# Patient Record
Sex: Female | Born: 1990 | Race: Black or African American | Hispanic: No | Marital: Single | Smoking: Current every day smoker
Health system: Southern US, Community
[De-identification: ages and names within clinical notes are randomized; demographics above are authoritative.]

## PROBLEM LIST (undated history)

## (undated) ENCOUNTER — Inpatient Hospital Stay (HOSPITAL_COMMUNITY): Payer: Self-pay

## (undated) DIAGNOSIS — Z789 Other specified health status: Secondary | ICD-10-CM

## (undated) HISTORY — PX: NO PAST SURGERIES: SHX2092

---

## 2014-05-09 ENCOUNTER — Encounter (HOSPITAL_COMMUNITY): Payer: Self-pay | Admitting: Emergency Medicine

## 2014-05-09 ENCOUNTER — Emergency Department (HOSPITAL_COMMUNITY)
Admission: EM | Admit: 2014-05-09 | Discharge: 2014-05-09 | Disposition: A | Discharge status: Home / Self Care | Payer: BLUE CROSS/BLUE SHIELD | Attending: Emergency Medicine | Admitting: Emergency Medicine

## 2014-05-09 DIAGNOSIS — Z3202 Encounter for pregnancy test, result negative: Secondary | ICD-10-CM | POA: Diagnosis not present

## 2014-05-09 DIAGNOSIS — R55 Syncope and collapse: Secondary | ICD-10-CM

## 2014-05-09 LAB — URINALYSIS, ROUTINE W REFLEX MICROSCOPIC
BILIRUBIN URINE: NEGATIVE
Glucose, UA: NEGATIVE mg/dL
Ketones, ur: NEGATIVE mg/dL
Nitrite: NEGATIVE
Protein, ur: 30 mg/dL — AB
Specific Gravity, Urine: 1.025 (ref 1.005–1.030)
UROBILINOGEN UA: 1 mg/dL (ref 0.0–1.0)
pH: 6 (ref 5.0–8.0)

## 2014-05-09 LAB — URINE MICROSCOPIC-ADD ON

## 2014-05-09 LAB — CBC
HCT: 35.2 % — ABNORMAL LOW (ref 36.0–46.0)
Hemoglobin: 11.6 g/dL — ABNORMAL LOW (ref 12.0–15.0)
MCH: 31.4 pg (ref 26.0–34.0)
MCHC: 33 g/dL (ref 30.0–36.0)
MCV: 95.1 fL (ref 78.0–100.0)
PLATELETS: 193 10*3/uL (ref 150–400)
RBC: 3.7 MIL/uL — AB (ref 3.87–5.11)
RDW: 12.3 % (ref 11.5–15.5)
WBC: 10.4 10*3/uL (ref 4.0–10.5)

## 2014-05-09 LAB — BASIC METABOLIC PANEL
ANION GAP: 9 (ref 5–15)
BUN: 8 mg/dL (ref 6–23)
CHLORIDE: 106 mmol/L (ref 96–112)
CO2: 25 mmol/L (ref 19–32)
CREATININE: 0.66 mg/dL (ref 0.50–1.10)
Calcium: 8.7 mg/dL (ref 8.4–10.5)
GFR calc Af Amer: >90 mL/min (ref >90)
GFR calc non Af Amer: >90 mL/min (ref >90)
GLUCOSE: 99 mg/dL (ref 70–99)
Potassium: 3.2 mmol/L — ABNORMAL LOW (ref 3.5–5.1)
Sodium: 140 mmol/L (ref 135–145)

## 2014-05-09 LAB — CBG MONITORING, ED: Glucose-Capillary: 88 mg/dL (ref 70–99)

## 2014-05-09 LAB — POC URINE PREG, ED: Preg Test, Ur: NEGATIVE

## 2014-05-09 MED ORDER — SODIUM CHLORIDE 0.9 % IV BOLUS (SEPSIS)
1000.0000 mL | Freq: Once | INTRAVENOUS | Status: AC
  Administered 2014-05-09: 1000 mL via INTRAVENOUS

## 2014-05-09 NOTE — Discharge Instructions (Signed)
Syncope °Syncope means a person passes out (faints). The person usually wakes up in less than 5 minutes. It is important to seek medical care for syncope. °HOME CARE °· Have someone stay with you until you feel normal. °· Do not drive, use machines, or play sports until your doctor says it is okay. °· Keep all doctor visits as told. °· Lie down when you feel like you might pass out. Take deep breaths. Wait until you feel normal before standing up. °· Drink enough fluids to keep your pee (urine) clear or pale yellow. °· If you take blood pressure or heart medicine, get up slowly. Take several minutes to sit and then stand. °GET HELP RIGHT AWAY IF:  °· You have a severe headache. °· You have pain in the chest, belly (abdomen), or back. °· You are bleeding from the mouth or butt (rectum). °· You have black or tarry poop (stool). °· You have an irregular or very fast heartbeat. °· You have pain with breathing. °· You keep passing out, or you have shaking (seizures) when you pass out. °· You pass out when sitting or lying down. °· You feel confused. °· You have trouble walking. °· You have severe weakness. °· You have vision problems. °If you fainted, call your local emergency services (911 in [place] Do not drive yourself to the hospital. °MAKE SURE YOU:  °· Understand these instructions. °· Will watch your condition. °· Will get help right away if you are not doing well or get worse. °Document Released: 06/30/2007 Document Revised: 07/13/2011 Document Reviewed: 03/12/2011 °ExitCare® Patient Information ©2015 [employer]. This information is not intended to replace advice given to you by your health care provider. Make sure you discuss any questions you have with your health care provider. ° ° °Emergency Department Resource Guide °1) Find a Doctor and Pay Out of Pocket °Although you won't have to find out who is covered by your insurance plan, it is a good idea to ask around and get recommendations. You will then need  to call the office and see if the doctor you have chosen will accept you as a new patient and what types of options they offer for patients who are self-pay. Some doctors offer discounts or will set up payment plans for their patients who do not have insurance, but you will need to ask so you aren't surprised when you get to your appointment. ° °2) Contact Your Local Health Department °Not all health departments have doctors that can see patients for sick visits, but many do, so it is worth a call to see if yours does. If you don't know where your local health department is, you can check in your phone book. The CDC also has a tool to help you locate your state's health department, and many state websites also have listings of all of their local health departments. ° °3) Find a Walk-in Clinic °If your illness is not likely to be very severe or complicated, you may want to try a walk in clinic. These are popping up all over the country in pharmacies, drugstores, and shopping centers. They're usually staffed by nurse practitioners or physician assistants that have been trained to treat common illnesses and complaints. They're usually fairly quick and inexpensive. However, if you have serious medical issues or chronic medical problems, these are probably not your best option. ° °No Primary Care Doctor: °- Call [facility] at  832-8000 - they can help you locate a primary care doctor   that  accepts your insurance, provides certain services, etc. °- Physician Referral Service- 1-800-533-3463 ° °Chronic Pain Problems: °Organization         Address  Phone   Notes  °[facility]  (336) 297-2271 Patients need to be referred by their primary care doctor.  ° °Medication Assistance: °Organization         Address  Phone   Notes  °[place] [facility] [address] °[place], [place] [address] (336) 641-8030 --Must be a resident of [place] °-- Must have NO insurance  coverage whatsoever (no Medicaid/ Medicare, etc.) °-- The pt. MUST have a primary care doctor that directs their care regularly and follows them in the community °  °[facility]  (866) 331-1348   °[employer]  (888) 892-1162   ° °Agencies that provide inexpensive medical care: °Organization         Address  Phone   Notes  °[facility]  (336) 832-8035   °[facility]    (336) 832-7272   °[facility] [address] °[place], [place] [address] (336) 832-4777   °[facility] [address], °[place] (336) 271-4999   °[facility]    (336) 373-0678   °[facility]    (336) 272-1050   °[facility] ° [address], [place] Phone:  (336) 832-4444, Fax:  (336) 832-4440 Hours of Operation:  9 am - 6 pm, M-F.  Also accepts Medicaid/Medicare and self-pay.  °[facility] ° [address], [place] Phone: (336) 832-3150, Fax: (336) 832-3151. Hours of Operation:  8:30 am - 5:30 pm, M-F.  Also accepts Medicaid and self-pay.  °[facility] [place] [address], [place] Phone: (336) 878-6027   °[facility] [address], [place], [place] (336)723-1848, Ext. 123 Mondays & Thursdays: 7-9 AM.  First 15 patients are seen on a first come, first serve basis. °  ° °Medicaid-accepting [place] Providers: ° °Organization         Address  Phone   Notes  °[facility] [address], [place] (336) 641-2100 Also accepts self-pay patients.  °[facility] [address], [place] ° (336) 856-9996   °[facility] [address], [place] (336) 288-8857   °[facility] [address] [place] [address], [place] (336) 299-7000   °Veita Bland [address], [place]  ° (336) 373-1557 Only accepts [place] Access Medicaid patients after they have their  name applied to their card.  ° °Self-Pay (no insurance) in [place]: ° °Organization         Address  Phone   Notes  °Sickle Cell Patients, [facility] [address], [place] (336) 832-1970   °[facility] [address], [place] (336) 832-4400   °[facility] ° [address], [place] (336) 992-4800   °[facility]/Dr. Osei-Bonsu ° [address] [place] [address], [place] or [address], [place] (336) 841-8500 Phone number for both [place] and [place] locations is the same.  °[facility] [address], [place] (336) 299-0000   °[facility]  [address] [place] [address],  or [address] (336) 852-7530 °(336) 878-2260   °  [facility] [address], [place] (336) 350-1642, phone; (336) 294-5005, fax Sees patients 1st and 3rd Saturday of every month.  Must not qualify for public or private insurance (i.e. Medicaid, Medicare, [place] Health Choice, Veterans' Benefits) • Household income should be no more than 200% of the poverty level •The clinic cannot treat you if you are pregnant or think you are pregnant • Sexually transmitted diseases are not treated at the clinic.  ° ° °Dental Care: °Organization         Address  Phone  Notes  °[facility] [address], [place] (336) 641-6152 Accepts children up to age 21 who are enrolled in Medicaid or [place] Health Choice; pregnant women with a Medicaid card; and children who have applied for Medicaid or [place] Health Choice, but were declined, whose parents can pay a reduced fee at time of service.  °[facility] [place]  [address], [place] (336) 641-7733 Accepts children up to age 21 who are enrolled in Medicaid or [place] Health Choice; pregnant women with a Medicaid card; and children who have applied for  Medicaid or [place] Health Choice, but were declined, whose parents can pay a reduced fee at time of service.  °[facility] ° [address], [place] (336) 641-4533 Patients are seen by appointment only. Walk-ins are not accepted. [facility] will see patients 18 years of age and older. °Monday - Tuesday (8am-5pm) °Most Wednesdays (8:30-5pm) °$30 per visit, cash only  °[facility] ° [address], [place] (336) 641-4533 Patients are seen by appointment only. Walk-ins are not accepted. [facility] will see patients 18 years of age and older. °One Wednesday Evening (Monthly: Volunteer Based).  $30 per visit, cash only  °[facility]  (919) 537-3737 for adults; Children under age 4, call Graduate Pediatric Dentistry at (919) [phone]. Children aged 4-14, please call (919) [phone] to request a pediatric application. ° Dental services are provided in all areas of dental care including fillings, crowns and bridges, complete and partial dentures, implants, gum treatment, root canals, and extractions. Preventive care is also provided. Treatment is provided to both adults and children. °Patients are selected via a lottery and there is often a waiting list. °  °[facility] [address], °[place] ° (336) 763-8833 [email or web address] °  °[facility] [address], [place], [place] (336)723-1848, Ext. 123 Second and Fourth Thursday of each month, opens at 6:30 AM; Clinic ends at 9 AM.  Patients are seen on a first-come first-served basis, and a limited number are seen during each clinic.  ° °[facility] ° [address], [place], [place] (336) 723-7904   Eligibility Requirements °You must have lived in [place], [place], or [place] counties for at least the last three months. °  You cannot be eligible for state or federal sponsored healthcare insurance, including Veterans Administration, Medicaid,  or Medicare. °  You generally cannot be eligible for healthcare insurance through your employer.  °  How to apply: °Eligibility screenings are held every Tuesday and Wednesday afternoon from 1:00 pm until 4:00 pm. You do not need an appointment for the interview!  °[facility] [address], [place], [place] 336-631-2330   °[facility]  336-342-8273   °[facility]  336-703-3100   °[facility]  336-570-6415   ° °Behavioral Health Resources in the Community: °Intensive Outpatient Programs °Organization         Address  Phone  Notes  °[facility] [address], [place], [place] 336-878-6098   °[facility] [address], [place], [place] 336-832-9800   °[facility] [address], [place], [place] ° 336-882-2125   °[facility] [address],  °[place], [place] 1-800-853-5163 or 336-641-4981   °Substance Abuse Resources °Organization         Address  Phone  Notes  °[facility]  336-882-2125   °[facility]  336-784-9470   °[facility]  336-285-9073   °[facility]  336-845-3988   °Residential & Outpatient Substance Abuse Program  1-800-659-3381   °Psychological Services °Organization         Address  Phone  Notes  °[facility]  336- 832-9600   °[facility]  336- 378-7881   °[facility] [address], [place] 1-800-853-5163 or 336-641-4981   ° °Mobile Crisis Teams °Organization         Address  Phone  Notes  °[facility], Mobile Crisis Care Unit  1-877-626-1772   °Assertive °[facility] ° [address] [place], [place] 336-834-9664   °Sharon DeEsch [address] °[place] 336-554-5454   ° °Self-Help/Support Groups °Organization         Address  Phone             Notes  °[facility] [place] [facility] - variety of support groups   336- 373-1402 Call for more information  °Narcotics Anonymous (NA), [facility] [address], °[place]  2 meetings at this location  ° °Residential Treatment Programs °Organization         Address  Phone  Notes  °[facility] [address],    °[place]  1-866-801-8205   °[facility] ° [address], [place], [place] 704-293-8524   °[facility] [address], [place] 336-845-3988 Admissions: 8am-3pm M-F  °[facility] [address]    °[place], [place] 336-841-1104   °[facility] [address], [place], [place] 336-379-7146   °[facility] [address]  °[place], [place] 336-285-9073   °[facility] Intensive Outpatient [address], [place], [place] 336-852-3033   °[facility] [address]  °[place], [place] 1-877-615-2722 or 336-784-9470   °[facility] [address] [place], [place] 336-227-7417 Accepts Medicaid  °[facility] [address]  ° [place] 1-800-659-3381 Substance Abuse/Addiction Treatment  ° °[place] Behavioral Health Resources °Organization         Address  Phone  Notes  °[facility]  (888) 581-9988   °Julie Brannon, PhD [address] [place], [place]   (336) 349-5553 or (336) 951-0000   °[facility]   [address] °[place], [place] (336) 349-4454   °[facility] [address], [place], [place] (336) 342-8316 Insurance/Medicaid/sponsorship through [facility]  °[facility] [address]                                    [place], [place] (336) 342-8316 Therapy/tele-psych/case  °[facility]   [address]  ° [place], [place] (336) 349-2233    °Dr. Arfeen  (336) 349-4544   °[facility] [place]  [employer] [place] [facility] 1) [address], [place] °2) [address], [place] °3)  [address], [place] (336) 349-3220 °(336) 342-7768 ° °(336) 342-8140   °[place] [facility] (336) 342-1394 or (336) 342-3537 (After Hours)    ° ° ° °

## 2014-05-09 NOTE — ED Notes (Signed)
Ambulated patient around unit, no complications. Stated she did not feel dizzy or weak, and that she was feeling better.

## 2014-05-09 NOTE — ED Provider Notes (Signed)
CSN: [id number]     Arrival date & time 05/09/14  [id number] History   First MD Initiated Contact with Patient 05/09/14 1012     Chief Complaint  Patient presents with  . Loss of Consciousness     (Consider location/radiation/quality/duration/timing/severity/associated sxs/prior Treatment) Patient is a 24 y.o. female presenting with syncope.  Loss of Consciousness Episode history:  Single Most recent episode:  Today Duration: thought to be brief. Timing: once. Progression:  Resolved Chronicity:  New Context comment:  Pt has had subjective fevers and chills for past 2-3 days.  felt better this morning.  syncope occured during hot shower Witnessed: no   Relieved by:  Nothing Ineffective treatments:  None tried Associated symptoms: no chest pain, no difficulty breathing, no nausea and no vomiting     History reviewed. No pertinent past medical history. History reviewed. No pertinent past surgical history. History reviewed. No pertinent family history. History  Substance Use Topics  . Smoking status: Not on file  . Smokeless tobacco: Not on file  . Alcohol Use: Not on file   OB History    No data available     Review of Systems  Cardiovascular: Positive for syncope. Negative for chest pain.  Gastrointestinal: Negative for nausea and vomiting.  All other systems reviewed and are negative.     Allergies  Review of patient's allergies indicates no known allergies.  Home Medications   Prior to Admission medications   Medication Sig Start Date End Date Taking? Authorizing Provider  acetaminophen (TYLENOL) 500 MG tablet Take 1,000 mg by mouth every 6 (six) hours as needed for mild pain or moderate pain.   Yes Historical Provider, MD  ibuprofen (ADVIL,MOTRIN) 200 MG tablet Take 600 mg by mouth every 6 (six) hours as needed for headache or moderate pain.   Yes Historical Provider, MD   BP 111/60 mmHg  Pulse 100  Temp(Src) 98.9 F (37.2 C) (Oral)  Resp 18  SpO2 100%  LMP  04/16/2014 Physical Exam  Constitutional: She is oriented to person, place, and time. She appears well-developed and well-nourished. No distress.  HENT:  Head: Normocephalic and atraumatic.  Mouth/Throat: Oropharynx is clear and moist.  Eyes: Conjunctivae are normal. Pupils are equal, round, and reactive to light. No scleral icterus.  Neck: Neck supple.  Cardiovascular: Normal rate, regular rhythm, normal heart sounds and intact distal pulses.   No murmur heard. Pulmonary/Chest: Effort normal and breath sounds normal. No stridor. No respiratory distress. She has no rales.  Abdominal: Soft. Bowel sounds are normal. She exhibits no distension. There is no tenderness.  Musculoskeletal: Normal range of motion.  Neurological: She is alert and oriented to person, place, and time.  Skin: Skin is warm and dry. No rash noted.  Psychiatric: She has a normal mood and affect. Her behavior is normal.  Nursing note and vitals reviewed.   ED Course  Procedures (including critical care time) Labs Review Labs Reviewed  CBC - Abnormal; Notable for the following:    RBC 3.70 (*)    Hemoglobin 11.6 (*)    HCT 35.2 (*)    All other components within normal limits  BASIC METABOLIC PANEL - Abnormal; Notable for the following:    Potassium 3.2 (*)    All other components within normal limits  URINALYSIS, ROUTINE W REFLEX MICROSCOPIC - Abnormal; Notable for the following:    Color, Urine AMBER (*)    APPearance CLOUDY (*)    Hgb urine dipstick LARGE (*)    Protein,  ur 30 (*)    Leukocytes, UA MODERATE (*)    All other components within normal limits  URINE MICROSCOPIC-ADD ON - Abnormal; Notable for the following:    Bacteria, UA FEW (*)    Casts HYALINE CASTS (*)    All other components within normal limits  CBG MONITORING, ED  POC URINE PREG, ED    Imaging Review No results found.   EKG Interpretation   Date/Time:  Thursday May 09 2014 10:13:06 EDT Ventricular Rate:  88 PR Interval:   147 QRS Duration: 91 QT Interval:  349 QTC Calculation: 422 R Axis:   58 Text Interpretation:  Sinus rhythm Borderline T abnormalities, anterior  leads No old tracing to compare Confirmed by [facility]  MD, TREY ([id number]) on  05/09/2014 11:16:24 AM      MDM   Final diagnoses:  Syncope, unspecified syncope type    Well appearing, nontoxic 24 yo female presenting after syncope.  She had a febrile illness for past several days.  She was feeling better, but still feeling rundown and fatigued.  She took a hot shower, during which she passed out.  Now, she feels better than she did when she woke up this morning.    Labs unremarkable.  EKG without delta wave, brugada, or prolonged QT.  She ambulated without difficulty or symptoms . Suspect she probably had syncope due to recent illness and hot shower.  She appears stable for dc.  Given return precautions.     Blake Divine, MD 05/09/14 1535

## 2014-05-09 NOTE — ED Notes (Signed)
Per EMS-states she was taking a shower got dizzy and passed out-doesn't know if she hit head-headache for 3 days-12 lead normal-BP 107/70, HR 95

## 2014-05-09 NOTE — ED Notes (Signed)
Bed: WA21 Expected date:  Expected time:  Means of arrival:  Comments: EMS-migraine

## 2016-03-29 ENCOUNTER — Inpatient Hospital Stay (HOSPITAL_COMMUNITY)
Admission: AD | Admit: 2016-03-29 | Discharge: 2016-03-29 | Disposition: A | Discharge status: Home / Self Care | Payer: BLUE CROSS/BLUE SHIELD | Source: Ambulatory Visit | Attending: Family Medicine | Admitting: Family Medicine

## 2016-03-29 ENCOUNTER — Encounter (HOSPITAL_COMMUNITY): Payer: Self-pay

## 2016-03-29 ENCOUNTER — Inpatient Hospital Stay (HOSPITAL_COMMUNITY): Payer: BLUE CROSS/BLUE SHIELD

## 2016-03-29 DIAGNOSIS — Z3A08 8 weeks gestation of pregnancy: Secondary | ICD-10-CM | POA: Diagnosis not present

## 2016-03-29 DIAGNOSIS — R109 Unspecified abdominal pain: Secondary | ICD-10-CM | POA: Insufficient documentation

## 2016-03-29 DIAGNOSIS — O4691 Antepartum hemorrhage, unspecified, first trimester: Secondary | ICD-10-CM | POA: Diagnosis not present

## 2016-03-29 DIAGNOSIS — O26899 Other specified pregnancy related conditions, unspecified trimester: Secondary | ICD-10-CM

## 2016-03-29 DIAGNOSIS — O209 Hemorrhage in early pregnancy, unspecified: Secondary | ICD-10-CM

## 2016-03-29 DIAGNOSIS — B9689 Other specified bacterial agents as the cause of diseases classified elsewhere: Secondary | ICD-10-CM

## 2016-03-29 DIAGNOSIS — O23591 Infection of other part of genital tract in pregnancy, first trimester: Secondary | ICD-10-CM | POA: Diagnosis not present

## 2016-03-29 DIAGNOSIS — N760 Acute vaginitis: Secondary | ICD-10-CM

## 2016-03-29 DIAGNOSIS — O3680X0 Pregnancy with inconclusive fetal viability, not applicable or unspecified: Secondary | ICD-10-CM

## 2016-03-29 HISTORY — DX: Other specified health status: Z78.9

## 2016-03-29 LAB — URINALYSIS, ROUTINE W REFLEX MICROSCOPIC
BILIRUBIN URINE: NEGATIVE
Glucose, UA: NEGATIVE mg/dL
Ketones, ur: NEGATIVE mg/dL
LEUKOCYTES UA: NEGATIVE
NITRITE: NEGATIVE
PROTEIN: 100 mg/dL — AB
Specific Gravity, Urine: 1.03 (ref 1.005–1.030)
pH: 6 (ref 5.0–8.0)

## 2016-03-29 LAB — CBC
HCT: 35.2 % — ABNORMAL LOW (ref 36.0–46.0)
HEMOGLOBIN: 11.8 g/dL — AB (ref 12.0–15.0)
MCH: 32.3 pg (ref 26.0–34.0)
MCHC: 33.5 g/dL (ref 30.0–36.0)
MCV: 96.4 fL (ref 78.0–100.0)
Platelets: 270 10*3/uL (ref 150–400)
RBC: 3.65 MIL/uL — ABNORMAL LOW (ref 3.87–5.11)
RDW: 12.6 % (ref 11.5–15.5)
WBC: 6.4 10*3/uL (ref 4.0–10.5)

## 2016-03-29 LAB — POCT PREGNANCY, URINE: PREG TEST UR: POSITIVE — AB

## 2016-03-29 LAB — WET PREP, GENITAL
Sperm: NONE SEEN
Trich, Wet Prep: NONE SEEN
WBC WET PREP: NONE SEEN
Yeast Wet Prep HPF POC: NONE SEEN

## 2016-03-29 LAB — HCG, QUANTITATIVE, PREGNANCY: hCG, Beta Chain, Quant, S: 122 m[IU]/mL — ABNORMAL HIGH (ref <5)

## 2016-03-29 MED ORDER — METRONIDAZOLE 500 MG PO TABS: 500.0000 mg | ORAL_TABLET | Freq: Two times a day (BID) | ORAL | 0 refills | Status: DC

## 2016-03-29 NOTE — MAU Provider Note (Signed)
History     CSN: [id number]  Arrival date and time: 03/29/16 9562   First Provider Initiated Contact with Patient 03/29/16 2108       Chief Complaint  Patient presents with  . Possible Pregnancy  . Abdominal Pain  . Vaginal Bleeding   HPI Jenny Gould is a 26 y.o. G1P0 at [redacted]w[redacted]d by unsure LMP who presents with abdominal pain & vaginal bleeding. Has positive HPT yesterday. Symptoms began over the weekend. Patient thought she was starting her period but "felt weird". Reports lower abdominal cramping that comes & goes. Rates pain 7/10. Has been taking ES tylenol with moderate relief. Vaginal bleeding that she has to wear a pad for. Is not saturating pads or passing clots. Describes light red blood & states the amount is comparable to the end of her period. Denies n/v/d, constipation, dysuria, vaginal discharge, or fever.   OB History    Gravida Para Term Preterm AB Living   1             SAB TAB Ectopic Multiple Live Births                  Past Medical History:  Diagnosis Date  . Medical history non-contributory     Past Surgical History:  Procedure Laterality Date  . NO PAST SURGERIES      No family history on file.  Social History  Substance Use Topics  . Smoking status: Not on file  . Smokeless tobacco: Not on file  . Alcohol use Not on file    Allergies: No Known Allergies  Prescriptions Prior to Admission  Medication Sig Dispense Refill Last Dose  . acetaminophen (TYLENOL) 500 MG tablet Take 1,000 mg by mouth every 6 (six) hours as needed for mild pain or moderate pain.   Past Week at Unknown time  . ibuprofen (ADVIL,MOTRIN) 200 MG tablet Take 600 mg by mouth every 6 (six) hours as needed for headache or moderate pain.   05/07/2014    Review of Systems  Constitutional: Negative.   Gastrointestinal: Positive for abdominal pain. Negative for constipation, diarrhea, nausea and vomiting.  Genitourinary: Positive for vaginal bleeding. Negative for dysuria and  vaginal discharge.  Musculoskeletal: Negative for back pain.   Physical Exam   Blood pressure 123/79, pulse 79, temperature 98.6 F (37 C), temperature source Oral, resp. rate 16, height 5\' 9"  (1.753 m), weight 186 lb (84.4 kg), last menstrual period 01/30/2016.  Physical Exam  Nursing note and vitals reviewed. Constitutional: She is oriented to person, place, and time. She appears well-developed and well-nourished. No distress.  HENT:  Head: Normocephalic and atraumatic.  Eyes: Conjunctivae are normal. Right eye exhibits no discharge. Left eye exhibits no discharge. No scleral icterus.  Neck: Normal range of motion.  Respiratory: Effort normal. No respiratory distress.  GI: Soft. She exhibits no distension. There is no tenderness.  Genitourinary: Uterus normal. Cervix exhibits no motion tenderness and no friability. Right adnexum displays no mass and no tenderness. Left adnexum displays no mass and no tenderness. There is bleeding (Kichline amount of dark red blood; no active bleeding) in the vagina.  Genitourinary Comments: Cervix closed  Neurological: She is alert and oriented to person, place, and time.  Skin: Skin is warm and dry. She is not diaphoretic.  Psychiatric: She has a normal mood and affect. Her behavior is normal. Judgment and thought content normal.    MAU Course  Procedures Results for orders placed or performed during the hospital encounter  of 03/29/16 (from the past 24 hour(s))  Urinalysis, Routine w reflex microscopic     Status: Abnormal   Collection Time: 03/29/16  7:27 PM  Result Value Ref Range   Color, Urine YELLOW YELLOW   APPearance HAZY (A) CLEAR   Specific Gravity, Urine 1.030 1.005 - 1.030   pH 6.0 5.0 - 8.0   Glucose, UA NEGATIVE NEGATIVE mg/dL   Hgb urine dipstick LARGE (A) NEGATIVE   Bilirubin Urine NEGATIVE NEGATIVE   Ketones, ur NEGATIVE NEGATIVE mg/dL   Protein, ur 409100 (A) NEGATIVE mg/dL   Nitrite NEGATIVE NEGATIVE   Leukocytes, UA NEGATIVE  NEGATIVE   RBC / HPF TOO NUMEROUS TO COUNT 0 - 5 RBC/hpf   WBC, UA 6-30 0 - 5 WBC/hpf   Bacteria, UA RARE (A) NONE SEEN   Squamous Epithelial / LPF 0-5 (A) NONE SEEN   Mucous PRESENT   Pregnancy, urine POC     Status: Abnormal   Collection Time: 03/29/16  7:44 PM  Result Value Ref Range   Preg Test, Ur POSITIVE (A) NEGATIVE  CBC     Status: Abnormal   Collection Time: 03/29/16  7:52 PM  Result Value Ref Range   WBC 6.4 4.0 - 10.5 K/uL   RBC 3.65 (L) 3.87 - 5.11 MIL/uL   Hemoglobin 11.8 (L) 12.0 - 15.0 g/dL   HCT 81.135.2 (L) 91.436.0 - 78.246.0 %   MCV 96.4 78.0 - 100.0 fL   MCH 32.3 26.0 - 34.0 pg   MCHC 33.5 30.0 - 36.0 g/dL   RDW 95.612.6 21.311.5 - 08.615.5 %   Platelets 270 150 - 400 K/uL  ABO/Rh     Status: None (Preliminary result)   Collection Time: 03/29/16  7:52 PM  Result Value Ref Range   ABO/RH(D) O POS   hCG, quantitative, pregnancy     Status: Abnormal   Collection Time: 03/29/16  7:52 PM  Result Value Ref Range   hCG, Beta Chain, Quant, S 122 (H) <5 mIU/mL   [place] Ob Comp Less 14 Wks  Result Date: 03/29/2016 CLINICAL DATA:  Vaginal bleeding and cramping EXAM: OBSTETRIC <14 WK US AND TRANSVAGINAL OB US TECHNIQUE: Both transabdominal and transvaginal ultrasound examinations were performed for complete evaluation of the gestation as well as the maternal uterus, adnexal regions, and pelvic cul-de-sac. Transvaginal technique was performed to assess early pregnancy. COMPARISON:  None. FINDINGS: Intrauterine gestational sac: Not visualized Yolk sac:  Not visualized Embryo:  Not visualized Maternal uterus/adnexae: Right ovary is within normal limits and measures 2.1 by 3.5 x 2.2 cm. The left ovary measures 1.9 by 3 x 1.7 cm. Adjacent to or exophytic from the left ovary is a simple cysts measuring 3.2 x 2.4 by 3 cm. No free fluid IMPRESSION: 1. No intrauterine pregnancy is visualized. Correlation with serial HCG and follow-up pelvic ultrasound as clinically indicated 2. 3.2 cm simple appearing left  adnexal cyst Electronically Signed   By: Jasmine PangKim  Fujinaga M.D.   On: 03/29/2016 20:45   [place] Ob Transvaginal  Result Date: 03/29/2016 CLINICAL DATA:  Vaginal bleeding and cramping EXAM: OBSTETRIC <14 WK US AND TRANSVAGINAL OB US TECHNIQUE: Both transabdominal and transvaginal ultrasound examinations were performed for complete evaluation of the gestation as well as the maternal uterus, adnexal regions, and pelvic cul-de-sac. Transvaginal technique was performed to assess early pregnancy. COMPARISON:  None. FINDINGS: Intrauterine gestational sac: Not visualized Yolk sac:  Not visualized Embryo:  Not visualized Maternal uterus/adnexae: Right ovary is within normal limits and measures 2.1  by 3.5 x 2.2 cm. The left ovary measures 1.9 by 3 x 1.7 cm. Adjacent to or exophytic from the left ovary is a simple cysts measuring 3.2 x 2.4 by 3 cm. No free fluid IMPRESSION: 1. No intrauterine pregnancy is visualized. Correlation with serial HCG and follow-up pelvic ultrasound as clinically indicated 2. 3.2 cm simple appearing left adnexal cyst Electronically Signed   By: Jasmine Pang M.D.   On: 03/29/2016 20:45    MDM +UPT UA, wet prep, GC/chlamydia, CBC, ABO/Rh, quant hCG, HIV, and [place] today to rule out ectopic pregnancy O positive BHCG 122 Ultrasound shows no IUP. "Simply left adnexal cyst".  This vaginal bleeding & abdominal pain could represent a normal pregnancy, spontaneous abortion, or even an ectopic pregnancy which can be life-threatening. Cultures were obtained to rule out pelvic infection.   Assessment and Plan  A: 1. Pregnancy of unknown anatomic location   2. Vaginal bleeding in pregnancy, first trimester   3. Abdominal pain affecting pregnancy   4. BV (bacterial vaginosis)    P: Discharge home Rx flagyl GC/CT pending Go to [facility] Thursday morning for repeat BHCG Pelvic rest Discussed reasons to return to MAU including s/s ectopic  Judeth Horn 03/29/2016, 9:08 PM

## 2016-03-29 NOTE — Discharge Instructions (Signed)
Return to care   If you have heavier bleeding that soaks through more that 2 pads per hour for an hour or more  If you bleed so much that you feel like you might pass out or you do pass out  If you have significant abdominal pain that is not improved with Tylenol   If you develop a fever > 100.5     Bacterial Vaginosis Bacterial vaginosis is a vaginal infection that occurs when the normal balance of bacteria in the vagina is disrupted. It results from an overgrowth of certain bacteria. This is the most common vaginal infection among women ages 29-44. Because bacterial vaginosis increases your risk for STIs (sexually transmitted infections), getting treated can help reduce your risk for chlamydia, gonorrhea, herpes, and HIV (human immunodeficiency virus). Treatment is also important for preventing complications in pregnant women, because this condition can cause an early (premature) delivery. What are the causes? This condition is caused by an increase in harmful bacteria that are normally present in Deetz amounts in the vagina. However, the reason that the condition develops is not fully understood. What increases the risk? The following factors may make you more likely to develop this condition:  Having a new sexual partner or multiple sexual partners.  Having unprotected sex.  Douching.  Having an intrauterine device (IUD).  Smoking.  Drug and alcohol abuse.  Taking certain antibiotic medicines.  Being pregnant. You cannot get bacterial vaginosis from toilet seats, bedding, swimming pools, or contact with objects around you. What are the signs or symptoms? Symptoms of this condition include:  Grey or white vaginal discharge. The discharge can also be watery or foamy.  A fish-like odor with discharge, especially after sexual intercourse or during menstruation.  Itching in and around the vagina.  Burning or pain with urination. Some women with bacterial vaginosis have  no signs or symptoms. How is this diagnosed? This condition is diagnosed based on:  Your medical history.  A physical exam of the vagina.  Testing a sample of vaginal fluid under a microscope to look for a large amount of bad bacteria or abnormal cells. Your health care provider may use a cotton swab or a Spinnato wooden spatula to collect the sample. How is this treated? This condition is treated with antibiotics. These may be given as a pill, a vaginal cream, or a medicine that is put into the vagina (suppository). If the condition comes back after treatment, a second round of antibiotics may be needed. Follow these instructions at home: Medicines   Take over-the-counter and prescription medicines only as told by your health care provider.  Take or use your antibiotic as told by your health care provider. Do not stop taking or using the antibiotic even if you start to feel better. General instructions   If you have a female sexual partner, tell her that you have a vaginal infection. She should see her health care provider and be treated if she has symptoms. If you have a female sexual partner, he does not need treatment.  During treatment:  Avoid sexual activity until you finish treatment.  Do not douche.  Avoid alcohol as directed by your health care provider.  Avoid breastfeeding as directed by your health care provider.  Drink enough water and fluids to keep your urine clear or pale yellow.  Keep the area around your vagina and rectum clean.  Wash the area daily with warm water.  Wipe yourself from front to back after using the toilet.  Keep all follow-up visits as told by your health care provider. This is important. How is this prevented?  Do not douche.  Wash the outside of your vagina with warm water only.  Use protection when having sex. This includes latex condoms and dental dams.  Limit how many sexual partners you have. To help prevent bacterial vaginosis, it  is best to have sex with just one partner (monogamous).  Make sure you and your sexual partner are tested for STIs.  Wear cotton or cotton-lined underwear.  Avoid wearing tight pants and pantyhose, especially during summer.  Limit the amount of alcohol that you drink.  Do not use any products that contain nicotine or tobacco, such as cigarettes and e-cigarettes. If you need help quitting, ask your health care provider.  Do not use illegal drugs. Where to find more information:  Centers for Disease Control and Prevention: SolutionApps.co.zawww.cdc.gov/std  American Sexual Health Association (ASHA): www.ashastd.org  U.S. Department of Health and Health and safety inspectorHuman Services, Office on Women's Health: ConventionalMedicines.siwww.womenshealth.gov/ or http://www.anderson-williamson.info/https://www.womenshealth.gov/a-z-topics/bacterial-vaginosis Contact a health care provider if:  Your symptoms do not improve, even after treatment.  You have more discharge or pain when urinating.  You have a fever.  You have pain in your abdomen.  You have pain during sex.  You have vaginal bleeding between periods. Summary  Bacterial vaginosis is a vaginal infection that occurs when the normal balance of bacteria in the vagina is disrupted.  Because bacterial vaginosis increases your risk for STIs (sexually transmitted infections), getting treated can help reduce your risk for chlamydia, gonorrhea, herpes, and HIV (human immunodeficiency virus). Treatment is also important for preventing complications in pregnant women, because the condition can cause an early (premature) delivery.  This condition is treated with antibiotic medicines. These may be given as a pill, a vaginal cream, or a medicine that is put into the vagina (suppository). This information is not intended to replace advice given to you by your health care provider. Make sure you discuss any questions you have with your health care provider. Document Released: 01/11/2005 Document Revised: 09/27/2015 Document Reviewed:  09/27/2015 Elsevier Interactive Patient Education  2017 Elsevier [employer]. Vaginal Bleeding During Pregnancy, First Trimester A Cleek amount of bleeding (spotting) from the vagina is relatively common in early pregnancy. It usually stops on its own. Various things may cause bleeding or spotting in early pregnancy. Some bleeding may be related to the pregnancy, and some may not. In most cases, the bleeding is normal and is not a problem. However, bleeding can also be a sign of something serious. Be sure to tell your health care provider about any vaginal bleeding right away. Some possible causes of vaginal bleeding during the first trimester include:  Infection or inflammation of the cervix.  Growths (polyps) on the cervix.  Miscarriage or threatened miscarriage.  Pregnancy tissue has developed outside of the uterus and in a fallopian tube (tubal pregnancy).  Tiny cysts have developed in the uterus instead of pregnancy tissue (molar pregnancy). Follow these instructions at home: Watch your condition for any changes. The following actions may help to lessen any discomfort you are feeling:  Follow your health care provider's instructions for limiting your activity. If your health care provider orders bed rest, you may need to stay in bed and only get up to use the bathroom. However, your health care provider may allow you to continue light activity.  If needed, make plans for someone to help with your regular activities and responsibilities while you are on bed  rest.  Keep track of the number of pads you use each day, how often you change pads, and how soaked (saturated) they are. Write this down.  Do not use tampons. Do not douche.  Do not have sexual intercourse or orgasms until approved by your health care provider.  If you pass any tissue from your vagina, save the tissue so you can show it to your health care provider.  Only take over-the-counter or prescription medicines as directed by  your health care provider.  Do not take aspirin because it can make you bleed.  Keep all follow-up appointments as directed by your health care provider. Contact a health care provider if:  You have any vaginal bleeding during any part of your pregnancy.  You have cramps or labor pains.  You have a fever, not controlled by medicine. Get help right away if:  You have severe cramps in your back or belly (abdomen).  You pass large clots or tissue from your vagina.  Your bleeding increases.  You feel light-headed or weak, or you have fainting episodes.  You have chills.  You are leaking fluid or have a gush of fluid from your vagina.  You pass out while having a bowel movement. This information is not intended to replace advice given to you by your health care provider. Make sure you discuss any questions you have with your health care provider. Document Released: 10/21/2004 Document Revised: 06/19/2015 Document Reviewed: 09/18/2012 Elsevier Interactive Patient Education  2017 [employer].

## 2016-03-29 NOTE — MAU Note (Signed)
Pt states that she missed her period in February. Had a +upt at home. States today she started having some light spotting and vaginal bleeding. Has some lower abdominal cramping like a period. Rates 7/10. Has not taken anything today. LMP: around 01/30/2016.

## 2016-03-30 LAB — GC/CHLAMYDIA PROBE AMP (~~LOC~~) NOT AT ARMC
Chlamydia: NEGATIVE
Neisseria Gonorrhea: NEGATIVE

## 2016-03-30 LAB — HIV ANTIBODY (ROUTINE TESTING W REFLEX): HIV Screen 4th Generation wRfx: NONREACTIVE

## 2016-03-30 LAB — ABO/RH: ABO/RH(D): O POS

## 2016-04-01 ENCOUNTER — Ambulatory Visit: Payer: BLUE CROSS/BLUE SHIELD | Admitting: *Deleted

## 2016-04-01 DIAGNOSIS — O3680X0 Pregnancy with inconclusive fetal viability, not applicable or unspecified: Secondary | ICD-10-CM

## 2016-04-01 LAB — HCG, QUANTITATIVE, PREGNANCY: hCG, Beta Chain, Quant, S: 36 m[IU]/mL — ABNORMAL HIGH (ref <5)

## 2016-04-01 NOTE — Progress Notes (Signed)
Pt in for stat hcg due to pregnancy of unknown location. She has no pain and the bleeding is going away. Pt stated that she can be reached at [phone]. Advised patient that I will call her after lunch with results. She had no further questions or concerns. Per Dr. Alysia PennaErvin patient should repeat hcg level in two weeks. Called patient and informed her. She will return on 3/22.

## 2016-04-02 ENCOUNTER — Inpatient Hospital Stay (HOSPITAL_COMMUNITY)
Admission: AD | Admit: 2016-04-02 | Discharge: 2016-04-02 | Disposition: A | Discharge status: Home / Self Care | Payer: BLUE CROSS/BLUE SHIELD | Source: Ambulatory Visit | Attending: Obstetrics & Gynecology | Admitting: Obstetrics & Gynecology

## 2016-04-02 DIAGNOSIS — Z3A09 9 weeks gestation of pregnancy: Secondary | ICD-10-CM | POA: Insufficient documentation

## 2016-04-02 DIAGNOSIS — O039 Complete or unspecified spontaneous abortion without complication: Secondary | ICD-10-CM | POA: Insufficient documentation

## 2016-04-02 NOTE — MAU Note (Signed)
Patient presents with thinking having a miscarriage, bleeding has stopped and having no pain.

## 2016-04-02 NOTE — Discharge Instructions (Signed)

## 2016-04-02 NOTE — MAU Provider Note (Signed)
  History     CSN: [id number]  Arrival date and time: 04/02/16 0910   None     No chief complaint on file.  Patient is a 26 year old G1 P0 at 9 weeks and 0 days who presents with concern for miscarriage. She was seen in the MA U about a week ago and had a Quant of 122. At that time she is having some cramping and mild bleeding. Repeat Quant 48 hours later showed 32. She has had no bleeding since then and reports no ongoing abdominal pain. She wants to know for sure today if she is miscarrying or not. She reports no fevers chills. No abnormal vaginal discharge.    OB History    Gravida Para Term Preterm AB Living   1             SAB TAB Ectopic Multiple Live Births                  Past Medical History:  Diagnosis Date  . Medical history non-contributory     Past Surgical History:  Procedure Laterality Date  . NO PAST SURGERIES      No family history on file.  Social History  Substance Use Topics  . Smoking status: Not on file  . Smokeless tobacco: Not on file  . Alcohol use Not on file    Allergies: No Known Allergies  Prescriptions Prior to Admission  Medication Sig Dispense Refill Last Dose  . acetaminophen (TYLENOL) 500 MG tablet Take 1,000 mg by mouth every 6 (six) hours as needed for mild pain or moderate pain.   Past Week at Unknown time  . metroNIDAZOLE (FLAGYL) 500 MG tablet Take 1 tablet (500 mg total) by mouth 2 (two) times daily. 14 tablet 0     Review of Systems  Constitutional: Negative for chills and fever.  HENT: Negative for congestion and rhinorrhea.   Respiratory: Negative for cough and shortness of breath.   Gastrointestinal: Negative for abdominal pain, constipation, diarrhea, nausea and vomiting.  Genitourinary: Negative for difficulty urinating, dysuria and flank pain.  Neurological: Negative for dizziness and weakness.   Physical Exam   Blood pressure 115/68, pulse 79, temperature 98.5 F (36.9 C), temperature source Oral, resp. rate  18, weight 184 lb 1.9 oz (83.5 kg), last menstrual period 01/30/2016.  Physical Exam  Constitutional: She is oriented to person, place, and time. She appears well-developed and well-nourished.  HENT:  Head: Normocephalic and atraumatic.  Eyes: Conjunctivae are normal. Pupils are equal, round, and reactive to light.  Cardiovascular: Normal rate.   Respiratory: Effort normal. No respiratory distress.  GI: Soft. She exhibits no distension. There is no tenderness.  Neurological: She is alert and oriented to person, place, and time.  Skin: Skin is warm and dry.  Psychiatric: She has a normal mood and affect. Her behavior is normal.    MAU Course  Procedures  MDM In MA U discussed with the patient that she most likely had miscarried given the drop Quant from 122-32. Advised that this time being 12 hours from the last Quant and too soon for a repeat ultrasound I could not tell her anything for certain although most likely a miscarriage.  Assessment and Plan  Miscarriage: Patient to follow-up in the office in 1-2 weeks for possible confirmatory negative UPT versus repeat ultrasound or beta hCG depending on patient's symptoms at that time.  Ernestina Pennaicholas Schenk 04/02/2016, 9:44 AM

## 2017-01-28 ENCOUNTER — Encounter (HOSPITAL_COMMUNITY): Payer: Self-pay

## 2017-03-05 ENCOUNTER — Inpatient Hospital Stay (HOSPITAL_COMMUNITY): Payer: BLUE CROSS/BLUE SHIELD

## 2017-03-05 ENCOUNTER — Encounter (HOSPITAL_COMMUNITY): Payer: Self-pay

## 2017-03-05 ENCOUNTER — Inpatient Hospital Stay (HOSPITAL_COMMUNITY)
Admission: AD | Admit: 2017-03-05 | Discharge: 2017-03-05 | Disposition: A | Discharge status: Home / Self Care | Payer: BLUE CROSS/BLUE SHIELD | Source: Ambulatory Visit | Attending: Obstetrics and Gynecology | Admitting: Obstetrics and Gynecology

## 2017-03-05 DIAGNOSIS — Z3201 Encounter for pregnancy test, result positive: Secondary | ICD-10-CM | POA: Diagnosis not present

## 2017-03-05 DIAGNOSIS — O26891 Other specified pregnancy related conditions, first trimester: Secondary | ICD-10-CM | POA: Diagnosis not present

## 2017-03-05 DIAGNOSIS — R102 Pelvic and perineal pain: Secondary | ICD-10-CM

## 2017-03-05 DIAGNOSIS — Z3A01 Less than 8 weeks gestation of pregnancy: Secondary | ICD-10-CM | POA: Diagnosis not present

## 2017-03-05 DIAGNOSIS — O3481 Maternal care for other abnormalities of pelvic organs, first trimester: Secondary | ICD-10-CM | POA: Insufficient documentation

## 2017-03-05 DIAGNOSIS — O3680X0 Pregnancy with inconclusive fetal viability, not applicable or unspecified: Secondary | ICD-10-CM | POA: Insufficient documentation

## 2017-03-05 DIAGNOSIS — N83202 Unspecified ovarian cyst, left side: Secondary | ICD-10-CM | POA: Insufficient documentation

## 2017-03-05 LAB — WET PREP, GENITAL
Clue Cells Wet Prep HPF POC: NONE SEEN
SPERM: NONE SEEN
Trich, Wet Prep: NONE SEEN
YEAST WET PREP: NONE SEEN

## 2017-03-05 LAB — URINALYSIS, ROUTINE W REFLEX MICROSCOPIC
Bilirubin Urine: NEGATIVE
GLUCOSE, UA: NEGATIVE mg/dL
Ketones, ur: NEGATIVE mg/dL
Leukocytes, UA: NEGATIVE
NITRITE: NEGATIVE
Protein, ur: NEGATIVE mg/dL
SPECIFIC GRAVITY, URINE: 1.026 (ref 1.005–1.030)
pH: 5 (ref 5.0–8.0)

## 2017-03-05 LAB — CBC
HEMATOCRIT: 34.7 % — AB (ref 36.0–46.0)
HEMOGLOBIN: 11.7 g/dL — AB (ref 12.0–15.0)
MCH: 31.2 pg (ref 26.0–34.0)
MCHC: 33.7 g/dL (ref 30.0–36.0)
MCV: 92.5 fL (ref 78.0–100.0)
Platelets: 279 10*3/uL (ref 150–400)
RBC: 3.75 MIL/uL — ABNORMAL LOW (ref 3.87–5.11)
RDW: 12.6 % (ref 11.5–15.5)
WBC: 10.1 10*3/uL (ref 4.0–10.5)

## 2017-03-05 LAB — POCT PREGNANCY, URINE: PREG TEST UR: POSITIVE — AB

## 2017-03-05 LAB — HCG, QUANTITATIVE, PREGNANCY: hCG, Beta Chain, Quant, S: 3952 m[IU]/mL — ABNORMAL HIGH (ref <5)

## 2017-03-05 NOTE — Discharge Instructions (Signed)
Ectopic Pregnancy An ectopic pregnancy is when the fertilized egg attaches (implants) outside the uterus. Most ectopic pregnancies occur in one of the tubes where eggs travel from the ovary to the uterus (fallopian tubes), but the implanting can occur in other locations. In rare cases, ectopic pregnancies occur on the ovary, intestine, pelvis, abdomen, or cervix. In an ectopic pregnancy, the fertilized egg does not have the ability to develop into a normal, healthy baby. A ruptured ectopic pregnancy is one in which tearing or bursting of a fallopian tube causes internal bleeding. Often, there is intense lower abdominal pain, and vaginal bleeding sometimes occurs. Having an ectopic pregnancy can be life-threatening. If this dangerous condition is not treated, it can lead to blood loss, shock, or even death. What are the causes? The most common cause of this condition is damage to one of the fallopian tubes. A fallopian tube may be narrowed or blocked, and that keeps the fertilized egg from reaching the uterus. What increases the risk? This condition is more likely to develop in women of childbearing age who have different levels of risk. The levels of risk can be divided into three categories. High risk  You have gone through infertility treatment.  You have had an ectopic pregnancy before.  You have had surgery on the fallopian tubes, or another surgical procedure, such as an abortion.  You have had surgery to have the fallopian tubes tied (tubal ligation).  You have problems or diseases of the fallopian tubes.  You have been exposed to diethylstilbestrol (DES). This medicine was used until 1971, and it had effects on babies whose mothers took the medicine.  You become pregnant while using an IUD (intrauterine device) for birth control. Moderate risk  You have a history of infertility.  You have had an STI (sexually transmitted infection).  You have a history of pelvic inflammatory  disease (PID).  You have scarring from endometriosis.  You have multiple sexual partners.  You smoke. Low risk  You have had pelvic surgery.  You use vaginal douches.  You became sexually active before age 18. What are the signs or symptoms? Common symptoms of this condition include normal pregnancy symptoms, such as missing a period, nausea, tiredness, abdominal pain, breast tenderness, and bleeding. However, ectopic pregnancy will have additional symptoms, such as:  Pain with intercourse.  Irregular vaginal bleeding or spotting.  Cramping or pain on one side or in the lower abdomen.  Fast heartbeat, low blood pressure, and sweating.  Passing out while having a bowel movement.  Symptoms of a ruptured ectopic pregnancy and internal bleeding may include:  Sudden, severe pain in the abdomen and pelvis.  Dizziness, weakness, light-headedness, or fainting.  Pain in the shoulder or neck area.  How is this diagnosed? This condition is diagnosed by:  A pelvic exam to locate pain or a mass in the abdomen.  A pregnancy test. This blood test checks for the presence as well as the specific level of pregnancy hormone in the bloodstream.  Ultrasound. This is performed if a pregnancy test is positive. In this test, a probe is inserted into the vagina. The probe will detect a fetus, possibly in a location other than the uterus.  Taking a sample of uterus tissue (dilation and curettage, or D&C).  Surgery to perform a visual exam of the inside of the abdomen using a thin, lighted tube that has a tiny camera on the end (laparoscope).  Culdocentesis. This procedure involves inserting a needle at the top   of the vagina, behind the uterus. If blood is present in this area, it may indicate that a fallopian tube is torn.  How is this treated? This condition is treated with medicine or surgery. Medicine  An injection of a medicine (methotrexate) may be given to cause the pregnancy tissue  to be absorbed. This medicine may save your fallopian tube. It may be given if: ? The diagnosis is made early, with no signs of active bleeding. ? The fallopian tube has not ruptured. ? You are considered to be a good candidate for the medicine. Usually, pregnancy hormone blood levels are checked after methotrexate treatment. This is to be sure that the medicine is effective. It may take 4-6 weeks for the pregnancy to be absorbed. Most pregnancies will be absorbed by 3 weeks. Surgery  A laparoscope may be used to remove the pregnancy tissue.  If severe internal bleeding occurs, a larger cut (incision) may be made in the lower abdomen (laparotomy) to remove the fetus and placenta. This is done to stop the bleeding.  Part or all of the fallopian tube may be removed (salpingectomy) along with the fetus and placenta. The fallopian tube may also be repaired during the surgery.  In very rare circumstances, removal of the uterus (hysterectomy) may be required.  After surgery, pregnancy hormone testing may be done to be sure that there is no pregnancy tissue left. Whether your treatment is medicine or surgery, you may receive a Rho (D) immune globulin shot to prevent problems with any future pregnancy. This shot may be given if:  You are Rh-negative and the baby's father is Rh-positive.  You are Rh-negative and you do not know the Rh type of the baby's father.  Follow these instructions at home:  Rest and limit your activity after the procedure for as long as told by your health care provider.  Until your health care provider says that it is safe: ? Do not lift anything that is heavier than 10 lb (4.5 kg), or the limit that your health care provider tells you. ? Avoid physical exercise and any movement that requires effort (is strenuous).  To help prevent constipation: ? Eat a healthy diet that includes fruits, vegetables, and whole grains. ? Drink 6-8 glasses of water per day. Get help  right away if:  You develop worsening pain that is not relieved by medicine.  You have: ? A fever or chills. ? Vaginal bleeding. ? Redness and swelling at the incision site. ? Nausea and vomiting.  You feel dizzy or weak.  You feel light-headed or you faint. This information is not intended to replace advice given to you by your health care provider. Make sure you discuss any questions you have with your health care provider. Document Released: 02/19/2004 Document Revised: 09/10/2015 Document Reviewed: 08/13/2015 Elsevier Interactive Patient Education  2018 Elsevier Inc.  

## 2017-03-05 NOTE — MAU Provider Note (Signed)
History     CSN: [id number]  Arrival date and time: 03/05/17 1522   First Provider Initiated Contact with Patient 03/05/17 1619      Chief Complaint  Patient presents with  . Pelvic Pain   Pelvic Pain  The patient's primary symptoms include pelvic pain. The patient's pertinent negatives include no vaginal discharge. This is a new problem. Episode onset: about 1-2 weeks  The problem occurs constantly. The problem has been gradually worsening. Pain severity now: 3/10. The problem affects the left side. She is pregnant. Pertinent negatives include no chills, dysuria, fever, nausea or vomiting. The vaginal discharge was normal. There has been no bleeding. Nothing aggravates the symptoms. She has tried nothing for the symptoms. She uses nothing for contraception. Menstrual history: LMP 01/17/17.    Past Medical History:  Diagnosis Date  . Medical history non-contributory     Past Surgical History:  Procedure Laterality Date  . NO PAST SURGERIES      History reviewed. No pertinent family history.  Social History   Tobacco Use  . Smoking status: Never Smoker  . Smokeless tobacco: Never Used  Substance Use Topics  . Alcohol use: No    Frequency: Never    Comment: before pregnant  . Drug use: No    Allergies: No Known Allergies  No medications prior to admission.    Review of Systems  Constitutional: Negative for chills and fever.  Gastrointestinal: Negative for nausea and vomiting.  Genitourinary: Positive for pelvic pain. Negative for dysuria, vaginal bleeding and vaginal discharge.   Physical Exam   Blood pressure 103/64, pulse 89, temperature 98.5 F (36.9 C), temperature source Oral, resp. rate 18, height 5\' 10"  (1.778 m), weight 182 lb 8 oz (82.8 kg), last menstrual period 01/17/2017, unknown if currently breastfeeding.  Physical Exam  Nursing note and vitals reviewed. Constitutional: She is oriented to person, place, and time. She appears well-developed and  well-nourished. No distress.  HENT:  Head: Normocephalic.  Cardiovascular: Normal rate.  Respiratory: Effort normal.  GI: Soft. There is no tenderness. There is no rebound.  Neurological: She is alert and oriented to person, place, and time.  Skin: Skin is warm and dry.  Psychiatric: She has a normal mood and affect.   Results for orders placed or performed during the hospital encounter of 03/05/17 (from the past 24 hour(s))  Urinalysis, Routine w reflex microscopic     Status: Abnormal   Collection Time: 03/05/17  3:38 PM  Result Value Ref Range   Color, Urine YELLOW YELLOW   APPearance CLEAR CLEAR   Specific Gravity, Urine 1.026 1.005 - 1.030   pH 5.0 5.0 - 8.0   Glucose, UA NEGATIVE NEGATIVE mg/dL   Hgb urine dipstick [place] (A) NEGATIVE   Bilirubin Urine NEGATIVE NEGATIVE   Ketones, ur NEGATIVE NEGATIVE mg/dL   Protein, ur NEGATIVE NEGATIVE mg/dL   Nitrite NEGATIVE NEGATIVE   Leukocytes, UA NEGATIVE NEGATIVE   RBC / HPF 0-5 0 - 5 RBC/hpf   WBC, UA 0-5 0 - 5 WBC/hpf   Bacteria, UA RARE (A) NONE SEEN   Squamous Epithelial / LPF 0-5 (A) NONE SEEN   Mucus PRESENT   Pregnancy, urine POC     Status: Abnormal   Collection Time: 03/05/17  4:01 PM  Result Value Ref Range   Preg Test, Ur POSITIVE (A) NEGATIVE  Wet prep, genital     Status: Abnormal   Collection Time: 03/05/17  4:26 PM  Result Value Ref Range  Yeast Wet Prep HPF POC NONE SEEN NONE SEEN   Trich, Wet Prep NONE SEEN NONE SEEN   Clue Cells Wet Prep HPF POC NONE SEEN NONE SEEN   WBC, Wet Prep HPF POC MODERATE (A) NONE SEEN   Sperm NONE SEEN   CBC     Status: Abnormal   Collection Time: 03/05/17  4:27 PM  Result Value Ref Range   WBC 10.1 4.0 - 10.5 K/uL   RBC 3.75 (L) 3.87 - 5.11 MIL/uL   Hemoglobin 11.7 (L) 12.0 - 15.0 g/dL   HCT 09.834.7 (L) 11.936.0 - 14.746.0 %   MCV 92.5 78.0 - 100.0 fL   MCH 31.2 26.0 - 34.0 pg   MCHC 33.7 30.0 - 36.0 g/dL   RDW 82.912.6 56.211.5 - 13.015.5 %   Platelets 279 150 - 400 K/uL  hCG, quantitative,  pregnancy     Status: Abnormal   Collection Time: 03/05/17  4:27 PM  Result Value Ref Range   hCG, Beta Chain, Quant, S 3,952 (H) <5 mIU/mL   [place] Ob Comp Less 14 Wks  Result Date: 03/05/2017 CLINICAL DATA:  Intermittent abdominal cramping. Pregnant patient. Patient is 6 weeks and 5 days pregnant based on her last menstrual period. EXAM: OBSTETRIC <14 WK US AND TRANSVAGINAL OB US TECHNIQUE: Both transabdominal and transvaginal ultrasound examinations were performed for complete evaluation of the gestation as well as the maternal uterus, adnexal regions, and pelvic cul-de-sac. Transvaginal technique was performed to assess early pregnancy. COMPARISON:  None. FINDINGS: Intrauterine gestational sac: Single Yolk sac:  Not Visualized. Embryo:  Not Visualized. Cardiac Activity: Not Visualized. MSD: 6.5 mm   5 w   2 d Subchorionic hemorrhage:  None visualized. Maternal uterus/adnexae: No uterine masses. Cervix is closed. There is an exophytic cyst arising from the left ovary measuring 3.4 cm in greatest dimension. This is a simple appearing cyst. Ovary otherwise unremarkable. Normal right ovary. No adnexal masses. Cravens amount of cul-de-sac free fluid. IMPRESSION: 1. Intrauterine gestational sac with no visualized embryo or yolk sac. Probable early intrauterine gestational sac, but no yolk sac, fetal pole, or cardiac activity yet visualized. Recommend follow-up quantitative B-HCG levels and follow-up US in 14 days to assess viability. This recommendation follows SRU consensus guidelines: Diagnostic Criteria for Nonviable Pregnancy Early in the First Trimester. Malva Limes Engl J Med 2013; [id number]:7846-96; 369:1443-51. 2. No subchorionic hemorrhage or other evidence of a pregnancy complication. 3. 3.4 cm simple left ovarian cyst. Electronically Signed   By: Amie Portlandavid  Ormond M.D.   On: 03/05/2017 17:08    MAU Course  Procedures  MDM   Assessment and Plan   1. Pregnancy, location unknown   2. Pelvic pain in pregnancy, antepartum, first  trimester    DC home Comfort measures reviewed  1st Trimester precautions  Bleeding precautions Ectopic precautions RX: NONE  Return to MAU as needed Repeat HCG in 48 hours   Patient can't come at 2:00 on Monday. She has to work until 6:00. Patient will come to MAU after work.   Follow-up Information    [facility]   Specialty:  Obstetrics and Gynecology Why:  [facility] 2:00 FOR REPEAT BLOOD WORK  Contact information: [address] [place] [id number] [phone]           Thressa ShellerHeather Kristalyn Bergstresser 03/05/2017, 5:34 PM

## 2017-03-05 NOTE — MAU Note (Signed)
Reports persistent left lower abdominal pain- intermittent, cramping, 6/10  No vaginal bleeding or discharge  Reports 2 + home upt

## 2017-03-06 LAB — RPR: RPR Ser Ql: NONREACTIVE

## 2017-03-06 LAB — HIV ANTIBODY (ROUTINE TESTING W REFLEX): HIV Screen 4th Generation wRfx: NONREACTIVE

## 2017-03-07 ENCOUNTER — Other Ambulatory Visit: Payer: Self-pay | Admitting: Obstetrics and Gynecology

## 2017-03-07 ENCOUNTER — Inpatient Hospital Stay (HOSPITAL_COMMUNITY)
Admission: AD | Admit: 2017-03-07 | Discharge: 2017-03-07 | Disposition: A | Discharge status: Home / Self Care | Payer: BLUE CROSS/BLUE SHIELD | Source: Ambulatory Visit | Attending: Obstetrics & Gynecology | Admitting: Obstetrics & Gynecology

## 2017-03-07 DIAGNOSIS — Z3A01 Less than 8 weeks gestation of pregnancy: Secondary | ICD-10-CM | POA: Insufficient documentation

## 2017-03-07 DIAGNOSIS — Z09 Encounter for follow-up examination after completed treatment for conditions other than malignant neoplasm: Secondary | ICD-10-CM | POA: Diagnosis not present

## 2017-03-07 DIAGNOSIS — O3680X0 Pregnancy with inconclusive fetal viability, not applicable or unspecified: Secondary | ICD-10-CM

## 2017-03-07 DIAGNOSIS — N912 Amenorrhea, unspecified: Secondary | ICD-10-CM

## 2017-03-07 DIAGNOSIS — E349 Endocrine disorder, unspecified: Secondary | ICD-10-CM

## 2017-03-07 LAB — GC/CHLAMYDIA PROBE AMP (~~LOC~~) NOT AT ARMC
CHLAMYDIA, DNA PROBE: NEGATIVE
Neisseria Gonorrhea: NEGATIVE

## 2017-03-07 LAB — HCG, QUANTITATIVE, PREGNANCY: HCG, BETA CHAIN, QUANT, S: 6669 m[IU]/mL — AB (ref <5)

## 2017-03-07 NOTE — Discharge Instructions (Signed)
Initiate OB care; list of providers given Start taking a prenatal vitamin Follow-up US will be scheduled in 2 weeks   First Trimester of Pregnancy The first trimester of pregnancy is from week 1 until the end of week 13 (months 1 through 3). During this time, your baby will begin to develop inside you. At 6-8 weeks, the eyes and face are formed, and the heartbeat can be seen on ultrasound. At the end of 12 weeks, all the baby's organs are formed. Prenatal care is all the medical care you receive before the birth of your baby. Make sure you get good prenatal care and follow all of your doctor's instructions. Follow these instructions at home: Medicines  Take over-the-counter and prescription medicines only as told by your doctor. Some medicines are safe and some medicines are not safe during pregnancy.  Take a prenatal vitamin that contains at least 600 micrograms (mcg) of folic acid.  If you have trouble pooping (constipation), take medicine that will make your stool soft (stool softener) if your doctor approves. Eating and drinking  Eat regular, healthy meals.  Your doctor will tell you the amount of weight gain that is right for you.  Avoid raw meat and uncooked cheese.  If you feel sick to your stomach (nauseous) or throw up (vomit): ? Eat 4 or 5 [place] meals a day instead of 3 large meals. ? Try eating a few soda crackers. ? Drink liquids between meals instead of during meals.  To prevent constipation: ? Eat foods that are high in fiber, like fresh fruits and vegetables, whole grains, and beans. ? Drink enough fluids to keep your pee (urine) clear or pale yellow. Activity  Exercise only as told by your doctor. Stop exercising if you have cramps or pain in your lower belly (abdomen) or low back.  Do not exercise if it is too hot, too humid, or if you are in a place of great height (high altitude).  Try to avoid standing for long periods of time. Move your legs often if you must  stand in one place for a long time.  Avoid heavy lifting.  Wear low-heeled shoes. Sit and stand up straight.  You can have sex unless your doctor tells you not to. Relieving pain and discomfort  Wear a good support bra if your breasts are sore.  Take warm water baths (sitz baths) to soothe pain or discomfort caused by hemorrhoids. Use hemorrhoid cream if your doctor says it is okay.  Rest with your legs raised if you have leg cramps or low back pain.  If you have puffy, bulging veins (varicose veins) in your legs: ? Wear support hose or compression stockings as told by your doctor. ? Raise (elevate) your feet for 15 minutes, 3-4 times a day. ? Limit salt in your food. Prenatal care  Schedule your prenatal visits by the twelfth week of pregnancy.  Write down your questions. Take them to your prenatal visits.  Keep all your prenatal visits as told by your doctor. This is important. Safety  Wear your seat belt at all times when driving.  Make a list of emergency phone numbers. The list should include numbers for family, friends, the hospital, and police and fire departments. General instructions  Ask your doctor for a referral to a local prenatal class. Begin classes no later than at the start of month 6 of your pregnancy.  Ask for help if you need counseling or if you need help with nutrition. Your  doctor can give you advice or tell you where to go for help.  Do not use hot tubs, steam rooms, or saunas.  Do not douche or use tampons or scented sanitary pads.  Do not cross your legs for long periods of time.  Avoid all herbs and alcohol. Avoid drugs that are not approved by your doctor.  Do not use any tobacco products, including cigarettes, chewing tobacco, and electronic cigarettes. If you need help quitting, ask your doctor. You may get counseling or other support to help you quit.  Avoid cat litter boxes and soil used by cats. These carry germs that can cause birth  defects in the baby and can cause a loss of your baby (miscarriage) or stillbirth.  Visit your dentist. At home, brush your teeth with a soft toothbrush. Be gentle when you floss. Contact a doctor if:  You are dizzy.  You have mild cramps or pressure in your lower belly.  You have a nagging pain in your belly area.  You continue to feel sick to your stomach, you throw up, or you have watery poop (diarrhea).  You have a bad smelling fluid coming from your vagina.  You have pain when you pee (urinate).  You have increased puffiness (swelling) in your face, hands, legs, or ankles. Get help right away if:  You have a fever.  You are leaking fluid from your vagina.  You have spotting or bleeding from your vagina.  You have very bad belly cramping or pain.  You gain or lose weight rapidly.  You throw up blood. It may look like coffee grounds.  You are around people who have [place] measles, fifth disease, or chickenpox.  You have a very bad headache.  You have shortness of breath.  You have any kind of trauma, such as from a fall or a car accident. Summary  The first trimester of pregnancy is from week 1 until the end of week 13 (months 1 through 3).  To take care of yourself and your unborn baby, you will need to eat healthy meals, take medicines only if your doctor tells you to do so, and do activities that are safe for you and your baby.  Keep all follow-up visits as told by your doctor. This is important as your doctor will have to ensure that your baby is healthy and growing well. This information is not intended to replace advice given to you by your health care provider. Make sure you discuss any questions you have with your health care provider. Document Released: 06/30/2007 Document Revised: 01/20/2016 Document Reviewed: 01/20/2016 Elsevier Interactive Patient Education  2017 [employer]

## 2017-03-07 NOTE — MAU Provider Note (Signed)
Ms. Jenny Gould  is a 27 y.o. G2P0010 at 5371w0d who presents to MAU today for follow-up quant hCG after 48 hours. She denies vaginal bleeding or abdominal pain.  BP 119/63 (BP Location: Right Arm)   Pulse 80   Temp 98.6 F (37 C) (Oral)   Resp 18   Ht 5\' 10"  (1.778 m)   Wt 82.6 kg (182 lb)   LMP 01/17/2017 (Approximate)   SpO2 99%   BMI 26.11 kg/m   CONSTITUTIONAL: Well-developed, well-nourished female in no acute distress.  ENT: External right and left ear normal.  EYES: EOM intact, conjunctivae normal.  MUSCULOSKELETAL: Normal range of motion.  CARDIOVASCULAR: Regular heart rate RESPIRATORY: Normal effort NEUROLOGICAL: Alert and oriented to person, place, and time.  SKIN: Skin is warm and dry. No rash noted. Not diaphoretic. No erythema. No pallor. PSYCH: Normal mood and affect. Normal behavior. Normal judgment and thought content.  A: Appropriate rise in quant hCG after 48 hours  P: Discharge home First trimester/ectopic precautions discussed Patient will return for follow-up US in 2 week. Order placed. They will call the patient with an appointment time Patient may return to MAU as needed or if her condition were to change or worsen   Pincus Largehelps, Jazma Y, DO 03/07/2017 9:22 PM

## 2017-03-07 NOTE — MAU Note (Signed)
Discharge instructions reviewed with patient.

## 2017-03-07 NOTE — MAU Note (Signed)
Pt states she is here for follow up hcg level. Pt denies pain or vaginal bleeding.

## 2017-03-22 ENCOUNTER — Ambulatory Visit: Payer: BLUE CROSS/BLUE SHIELD | Admitting: General Practice

## 2017-03-22 ENCOUNTER — Encounter: Payer: Self-pay | Admitting: Obstetrics & Gynecology

## 2017-03-22 ENCOUNTER — Ambulatory Visit (HOSPITAL_COMMUNITY)
Admission: RE | Admit: 2017-03-22 | Discharge: 2017-03-22 | Disposition: A | Discharge status: Home / Self Care | Payer: BLUE CROSS/BLUE SHIELD | Source: Ambulatory Visit | Attending: Obstetrics and Gynecology | Admitting: Obstetrics and Gynecology

## 2017-03-22 DIAGNOSIS — Z3491 Encounter for supervision of normal pregnancy, unspecified, first trimester: Secondary | ICD-10-CM | POA: Insufficient documentation

## 2017-03-22 DIAGNOSIS — Z712 Person consulting for explanation of examination or test findings: Secondary | ICD-10-CM

## 2017-03-22 DIAGNOSIS — Z331 Pregnant state, incidental: Secondary | ICD-10-CM

## 2017-03-22 DIAGNOSIS — O3680X0 Pregnancy with inconclusive fetal viability, not applicable or unspecified: Secondary | ICD-10-CM

## 2017-03-22 DIAGNOSIS — Z3490 Encounter for supervision of normal pregnancy, unspecified, unspecified trimester: Secondary | ICD-10-CM | POA: Diagnosis present

## 2017-03-22 MED ORDER — PRENATAL 27-1 MG PO TABS: 1.0000 | ORAL_TABLET | Freq: Every day | ORAL | 2 refills | Status: DC

## 2017-03-22 NOTE — Progress Notes (Signed)
Patient here for viability results today. Reviewed ultrasound report with Dr Rachelle HoraMoss who finds living IUP, patient should begin prenatal care. Informed patient of results, provided dating information, & gave pictures to patient. Patient verbalized understanding to all and denies taking medication. Patient is requesting Rx for prenatal vitamins. Told patient we will send that to her pharmacy & recommended she begin OB care. Patient verbalized understanding to all & had no questions

## 2017-03-22 NOTE — Addendum Note (Signed)
Addended by: Kathee DeltonHILLMAN, Esteen Delpriore L on: 03/22/2017 10:00 AM   Modules accepted: Orders

## 2017-04-06 ENCOUNTER — Encounter (HOSPITAL_COMMUNITY): Payer: Self-pay

## 2017-04-06 ENCOUNTER — Inpatient Hospital Stay (HOSPITAL_COMMUNITY)
Admission: AD | Admit: 2017-04-06 | Discharge: 2017-04-06 | Disposition: A | Discharge status: Home / Self Care | Payer: BLUE CROSS/BLUE SHIELD | Source: Ambulatory Visit | Attending: Obstetrics & Gynecology | Admitting: Obstetrics & Gynecology

## 2017-04-06 ENCOUNTER — Other Ambulatory Visit: Payer: Self-pay

## 2017-04-06 DIAGNOSIS — O26891 Other specified pregnancy related conditions, first trimester: Secondary | ICD-10-CM | POA: Diagnosis not present

## 2017-04-06 DIAGNOSIS — O219 Vomiting of pregnancy, unspecified: Secondary | ICD-10-CM

## 2017-04-06 DIAGNOSIS — R109 Unspecified abdominal pain: Secondary | ICD-10-CM

## 2017-04-06 DIAGNOSIS — Z3A09 9 weeks gestation of pregnancy: Secondary | ICD-10-CM | POA: Diagnosis not present

## 2017-04-06 LAB — URINALYSIS, ROUTINE W REFLEX MICROSCOPIC
BILIRUBIN URINE: NEGATIVE
GLUCOSE, UA: NEGATIVE mg/dL
HGB URINE DIPSTICK: NEGATIVE
KETONES UR: 5 mg/dL — AB
Leukocytes, UA: NEGATIVE
Nitrite: NEGATIVE
PH: 5 (ref 5.0–8.0)
Protein, ur: NEGATIVE mg/dL
Specific Gravity, Urine: 1.025 (ref 1.005–1.030)

## 2017-04-06 MED ORDER — DOXYLAMINE-PYRIDOXINE 10-10 MG PO TBEC: DELAYED_RELEASE_TABLET | ORAL | 5 refills | Status: DC

## 2017-04-06 NOTE — MAU Provider Note (Signed)
Chief Complaint: Abdominal Pain   First Provider Initiated Contact with Patient 04/06/17 1654      SUBJECTIVE HPI: Jenny Gould is a 27 y.o. G2P0010 at [redacted]w[redacted]d by LMP who presents to maternity admissions reporting abdominal cramping x 2 days. She reports the pain is low in her abdomen, on both sides, intermittent, and feels like menstrual cramping. It is unchanged in intensity since onset. It radiates down into the vagina.  She reports daily nausea and difficulty keeping down food but is drinking plenty of fluids.  She is not taking anything for her nausea. She reports last bowel movement 3 days ago which is normal for her.  There are no other associated symptoms. She has not tried any treatments for the pain. IUP confirmed by [place] on 03/22/17.   HPI  Past Medical History:  Diagnosis Date  . Medical history non-contributory    Past Surgical History:  Procedure Laterality Date  . NO PAST SURGERIES     Social History   Socioeconomic History  . Marital status: Single    Spouse name: Not on file  . Number of children: Not on file  . Years of education: Not on file  . Highest education level: Not on file  Social Needs  . Financial resource strain: Not on file  . Food insecurity - worry: Not on file  . Food insecurity - inability: Not on file  . Transportation needs - medical: Not on file  . Transportation needs - non-medical: Not on file  Occupational History  . Not on file  Tobacco Use  . Smoking status: Never Smoker  . Smokeless tobacco: Never Used  Substance and Sexual Activity  . Alcohol use: No    Frequency: Never    Comment: before pregnant  . Drug use: No  . Sexual activity: Yes  Other Topics Concern  . Not on file  Social History Narrative  . Not on file   No current facility-administered medications on file prior to encounter.    Current Outpatient Medications on File Prior to Encounter  Medication Sig Dispense Refill  . Prenat-Fe Carbonyl-FA-Omega 3 (ONE-A-DAY  WOMENS PRENATAL 1 PO) Take 2 tablets by mouth daily.    Marland Kitchen PRENATAL 27-1 MG TABS Take 1 tablet by mouth daily. (Patient not taking: Reported on 04/06/2017) 30 each 2   No Known Allergies  ROS:  Review of Systems  Constitutional: Negative for chills, fatigue and fever.  Respiratory: Negative for shortness of breath.   Cardiovascular: Negative for chest pain.  Gastrointestinal: Positive for abdominal pain, nausea and vomiting.  Genitourinary: Positive for pelvic pain. Negative for difficulty urinating, dysuria, flank pain, vaginal bleeding, vaginal discharge and vaginal pain.  Neurological: Negative for dizziness and headaches.  Psychiatric/Behavioral: Negative.      I have reviewed patient's Past Medical Hx, Surgical Hx, Family Hx, Social Hx, medications and allergies.   Physical Exam   Patient Vitals for the past 24 hrs:  BP Temp Pulse Resp SpO2 Weight  04/06/17 1634 119/63 98.3 F (36.8 C) 82 18 100 % 174 lb 12 oz (79.3 kg)   Constitutional: Well-developed, well-nourished female in no acute distress.  Cardiovascular: normal rate Respiratory: normal effort GI: Abd soft, non-tender. Pos BS x 4 MS: Extremities nontender, no edema, normal ROM Neurologic: Alert and oriented x 4.  GU: Neg CVAT.   Bimanual exam: Cervix 0/long/high, firm, posterior, neg CMT, uterus nontender,~9 week size, adnexa without tenderness, enlargement, or mass   LAB RESULTS Results for orders placed or performed  during the hospital encounter of 04/06/17 (from the past 24 hour(s))  Urinalysis, Routine w reflex microscopic     Status: Abnormal   Collection Time: 04/06/17  4:11 PM  Result Value Ref Range   Color, Urine YELLOW YELLOW   APPearance HAZY (A) CLEAR   Specific Gravity, Urine 1.025 1.005 - 1.030   pH 5.0 5.0 - 8.0   Glucose, UA NEGATIVE NEGATIVE mg/dL   Hgb urine dipstick NEGATIVE NEGATIVE   Bilirubin Urine NEGATIVE NEGATIVE   Ketones, ur 5 (A) NEGATIVE mg/dL   Protein, ur NEGATIVE NEGATIVE  mg/dL   Nitrite NEGATIVE NEGATIVE   Leukocytes, UA NEGATIVE NEGATIVE       IMAGING Limited OB US Date:04/06/17 EDD : 11/06/17  based on 7 week US Viability:  FHT 180, amniotic fluid subjectively normal, CRL measurement c/w previous dates   MAU Management/MDM: US with normal IUP today at bedside.  UA with no evidence of infection but ketones c/w pt n/v.  Urine sent for culture.  Will treat nausea with Diclegis and pt to drink more fluids.  Rest/ice/heat/warm bath/Tylenol for pain.  F/U with early prenatal care, list of providers given.  Pt discharged with strict first trimester precautions.  ASSESSMENT 1. Abdominal pain during pregnancy in first trimester   2. Nausea and vomiting during pregnancy prior to [redacted] weeks gestation     PLAN Discharge home Allergies as of 04/06/2017   No Known Allergies     Medication List    STOP taking these medications   PRENATAL 27-1 MG Tabs     TAKE these medications   Doxylamine-Pyridoxine 10-10 MG Tbec Commonly known as:  DICLEGIS Take 2 tabs at bedtime. If needed, add another tab in the morning. If needed, add another tab in the afternoon, up to 4 tabs/day.   ONE-A-DAY WOMENS PRENATAL 1 PO Take 2 tablets by mouth daily.      Follow-up Information    Prenatal provider of your choice Follow up.   Why:  See list provided. Return to MAU as needed for emergencies.          Sharen CounterLisa Leftwich-Kirby Certified Nurse-Midwife 04/06/2017  5:27 PM

## 2017-04-06 NOTE — MAU Note (Signed)
Urine in lab 

## 2017-04-06 NOTE — MAU Note (Signed)
Pt states she has been experiencing abdominal pain starting on Monday. She states she hasn't been able to sleep or eat due to the pain.

## 2017-04-06 NOTE — Discharge Instructions (Signed)
Nausea medication to take during pregnancy:   Unisom (doxylamine succinate 25 mg tablets) Take one tablet daily at bedtime. If symptoms are not adequately controlled, the dose can be increased to a maximum recommended dose of two tablets daily (1/2 tablet in the morning, 1/2 tablet mid-afternoon and one at bedtime).  Vitamin B6 100mg  tablets. Take one tablet twice a day (up to 200 mg per day).  [facility]    [facility]       Phone: [phone]  [facility] [place] Phone: [phone]  [facility]  Phone: [phone]  [facility] [place] [place]  Phone: [phone]  [facility] [place]  Phone: [phone]  [facility]       Phone: [phone]  [facility]    Phone: [phone]   [facility]    Phone: [phone]  [facility]    Phone: [phone]  [facility]    Phone: [phone]  [facility]    Phone: [phone]  [facility]       Phone: [phone]   [facility]  Phone: [phone]  [facility]    Phone: [phone]  [facility] [place]   Phone: [phone]  [facility]      Phone: [phone]  [facility]    Phone: [phone]

## 2017-04-07 LAB — CULTURE, OB URINE: CULTURE: NO GROWTH

## 2017-04-19 ENCOUNTER — Ambulatory Visit (INDEPENDENT_AMBULATORY_CARE_PROVIDER_SITE_OTHER): Payer: Medicaid Other | Admitting: Family Medicine

## 2017-04-19 ENCOUNTER — Encounter: Payer: Self-pay | Admitting: Family Medicine

## 2017-04-19 ENCOUNTER — Encounter: Payer: Self-pay | Admitting: *Deleted

## 2017-04-19 VITALS — BP 105/60 | HR 84

## 2017-04-19 DIAGNOSIS — O1211 Gestational proteinuria, first trimester: Secondary | ICD-10-CM

## 2017-04-19 DIAGNOSIS — Z3490 Encounter for supervision of normal pregnancy, unspecified, unspecified trimester: Secondary | ICD-10-CM | POA: Insufficient documentation

## 2017-04-19 DIAGNOSIS — O1210 Gestational proteinuria, unspecified trimester: Secondary | ICD-10-CM

## 2017-04-19 DIAGNOSIS — Z3481 Encounter for supervision of other normal pregnancy, first trimester: Secondary | ICD-10-CM

## 2017-04-19 LAB — POCT URINALYSIS DIP (DEVICE)
Glucose, UA: 100 mg/dL — AB
HGB URINE DIPSTICK: NEGATIVE
KETONES UR: NEGATIVE mg/dL
Leukocytes, UA: NEGATIVE
Nitrite: NEGATIVE
PH: 6.5 (ref 5.0–8.0)
Protein, ur: 30 mg/dL — AB
Specific Gravity, Urine: >=1.03 (ref 1.005–1.030)
Urobilinogen, UA: 1 mg/dL (ref 0.0–1.0)

## 2017-04-19 NOTE — Progress Notes (Signed)
   PRENATAL VISIT NOTE  Subjective:  Jenny Gould is a 27 y.o. G2P0010 at 5353w2d being seen today for new prenatal visit. She denies any past medical history. This was a planned pregnancy. She plans to breastfeed. Unsure what she will want for birth control.  Patient reports no complaints.  Does endorses some nausea, and has just started taking Diclegis. Denies vaginal bleeding, cramping, abdominal pain, or leakage of fluid.   The following portions of the patient's history were reviewed and updated as appropriate: allergies, current medications, past family history, past medical history, past social history, past surgical history and problem list. Problem list updated.  Objective:   Vitals:   04/19/17 1055  BP: 105/60  Pulse: 84    Fetal Status: Fetal Heart Rate (bpm): 178         General:  Alert, oriented and cooperative. Patient is in no acute distress.  HEENT: Neck: [place]/AT. No scleral icturus. Normal oral mucosa  Supple, no thyromegaly   Skin: Skin is warm and dry. No rash noted.   Cardiovascular: Normal heart rate noted  Respiratory: Normal respiratory effort, no problems with respiration noted  Abdomen: Soft, nontender, non distended. Fundus below pubic symphysis.        Pelvic: Cervical exam deferred        Extremities: Normal range of motion.     Mental Status:  Normal mood and affect. Normal behavior. Normal judgment and thought content.   Assessment and Plan:  Pregnancy: G2P0010 at 7553w2d  1. Encounter for supervision of low-risk pregnancy, antepartum - Obstetric Panel, Including HIV - SMN1 COPY NUMBER ANALYSIS (SMA Carrier Screen) - Culture, OB Urine - Hemoglobinopathy Evaluation - [facility] AMB BABYSCRIPTS OPT IN - US MFM OB COMP + 14 WK; Future - Gc/Chlamydia previously done in MAU  2. Proteinuria in urine: likely concentrate, SG >1.03. Check UPC  Follow up in 4 weeks  Frederik PearJulie P Aaiden Depoy, MD  Future Appointments  Date Time Provider Department Center  05/17/2017   9:55 AM Pincus LargePhelps, Jazma Y, DO [facility]  06/14/2017 10:15 AM [facility] US 4 [facility]US [facility]

## 2017-04-19 NOTE — Patient Instructions (Signed)

## 2017-04-19 NOTE — Progress Notes (Signed)
Here for new ob. Declines flu shot.  Already has MyChart. Signed up for Babyscripts app only; declines optimization.

## 2017-04-20 LAB — PROTEIN / CREATININE RATIO, URINE
Creatinine, Urine: 307 mg/dL
PROTEIN UR: 37.3 mg/dL
PROTEIN/CREAT RATIO: 121 mg/g{creat} (ref 0–200)

## 2017-04-21 LAB — CULTURE, OB URINE

## 2017-04-21 LAB — URINE CULTURE, OB REFLEX

## 2017-04-25 ENCOUNTER — Encounter: Payer: Self-pay | Admitting: Family Medicine

## 2017-04-26 LAB — OBSTETRIC PANEL, INCLUDING HIV
Antibody Screen: NEGATIVE
BASOS ABS: 0 10*3/uL (ref 0.0–0.2)
Basos: 0 %
EOS (ABSOLUTE): 0 10*3/uL (ref 0.0–0.4)
EOS: 1 %
HEMOGLOBIN: 11.7 g/dL (ref 11.1–15.9)
HEP B S AG: NEGATIVE
HIV SCREEN 4TH GENERATION: NONREACTIVE
Hematocrit: 34.5 % (ref 34.0–46.6)
IMMATURE GRANULOCYTES: 0 %
Immature Grans (Abs): 0 10*3/uL (ref 0.0–0.1)
LYMPHS ABS: 2 10*3/uL (ref 0.7–3.1)
Lymphs: 28 %
MCH: 30.9 pg (ref 26.6–33.0)
MCHC: 33.9 g/dL (ref 31.5–35.7)
MCV: 91 fL (ref 79–97)
MONOCYTES: 8 %
Monocytes Absolute: 0.6 10*3/uL (ref 0.1–0.9)
NEUTROS ABS: 4.6 10*3/uL (ref 1.4–7.0)
Neutrophils: 63 %
PLATELETS: 250 10*3/uL (ref 150–379)
RBC: 3.79 x10E6/uL (ref 3.77–5.28)
RDW: 13.4 % (ref 12.3–15.4)
RH TYPE: POSITIVE
RPR: NONREACTIVE
RUBELLA: 1.61 {index} (ref >0.99)
WBC: 7.3 10*3/uL (ref 3.4–10.8)

## 2017-04-26 LAB — SMN1 COPY NUMBER ANALYSIS (SMA CARRIER SCREENING)

## 2017-04-26 LAB — HEMOGLOBINOPATHY EVALUATION
FERRITIN: 92 ng/mL (ref 15–150)
HGB A: 97.7 % (ref 96.4–98.8)
HGB F QUANT: 0 % (ref 0.0–2.0)
Hgb A2 Quant: 2.3 % (ref 1.8–3.2)
Hgb C: 0 %
Hgb S: 0 %
Hgb Solubility: NEGATIVE
Hgb Variant: 0 %

## 2017-05-17 ENCOUNTER — Ambulatory Visit (INDEPENDENT_AMBULATORY_CARE_PROVIDER_SITE_OTHER): Payer: BLUE CROSS/BLUE SHIELD | Admitting: Obstetrics and Gynecology

## 2017-05-17 VITALS — BP 104/57 | HR 88 | Wt 167.6 lb

## 2017-05-17 DIAGNOSIS — Z3492 Encounter for supervision of normal pregnancy, unspecified, second trimester: Secondary | ICD-10-CM

## 2017-05-17 NOTE — Addendum Note (Signed)
Addended by: Henrietta DineNEAL, PAMELA S on: 05/17/2017 10:33 AM   Modules accepted: Orders

## 2017-05-17 NOTE — Patient Instructions (Signed)
Second Trimester of Pregnancy The second trimester is from week 13 through week 28, month 4 through 6. This is often the time in pregnancy that you feel your best. Often times, morning sickness has lessened or quit. You may have more energy, and you may get hungry more often. Your unborn baby (fetus) is growing rapidly. At the end of the sixth month, he or she is about 9 inches long and weighs about 1 pounds. You will likely feel the baby move (quickening) between 18 and 20 weeks of pregnancy. Follow these instructions at home:  Avoid all smoking, herbs, and alcohol. Avoid drugs not approved by your doctor.  Do not use any tobacco products, including cigarettes, chewing tobacco, and electronic cigarettes. If you need help quitting, ask your doctor. You may get counseling or other support to help you quit.  Only take medicine as told by your doctor. Some medicines are safe and some are not during pregnancy.  Exercise only as told by your doctor. Stop exercising if you start having cramps.  Eat regular, healthy meals.  Wear a good support bra if your breasts are tender.  Do not use hot tubs, steam rooms, or saunas.  Wear your seat belt when driving.  Avoid raw meat, uncooked cheese, and liter boxes and soil used by cats.  Take your prenatal vitamins.  Take 1500-2000 milligrams of calcium daily starting at the 20th week of pregnancy until you deliver your baby.  Try taking medicine that helps you poop (stool softener) as needed, and if your doctor approves. Eat more fiber by eating fresh fruit, vegetables, and whole grains. Drink enough fluids to keep your pee (urine) clear or pale yellow.  Take warm water baths (sitz baths) to soothe pain or discomfort caused by hemorrhoids. Use hemorrhoid cream if your doctor approves.  If you have puffy, bulging veins (varicose veins), wear support hose. Raise (elevate) your feet for 15 minutes, 3-4 times a day. Limit salt in your diet.  Avoid heavy  lifting, wear low heals, and sit up straight.  Rest with your legs raised if you have leg cramps or low back pain.  Visit your dentist if you have not gone during your pregnancy. Use a soft toothbrush to brush your teeth. Be gentle when you floss.  You can have sex (intercourse) unless your doctor tells you not to.  Go to your doctor visits. Get help if:  You feel dizzy.  You have mild cramps or pressure in your lower belly (abdomen).  You have a nagging pain in your belly area.  You continue to feel sick to your stomach (nauseous), throw up (vomit), or have watery poop (diarrhea).  You have bad smelling fluid coming from your vagina.  You have pain with peeing (urination). Get help right away if:  You have a fever.  You are leaking fluid from your vagina.  You have spotting or bleeding from your vagina.  You have severe belly cramping or pain.  You lose or gain weight rapidly.  You have trouble catching your breath and have chest pain.  You notice sudden or extreme puffiness (swelling) of your face, hands, ankles, feet, or legs.  You have not felt the baby move in over an hour.  You have severe headaches that do not go away with medicine.  You have vision changes. This information is not intended to replace advice given to you by your health care provider. Make sure you discuss any questions you have with your health care   provider. Document Released: 04/07/2009 Document Revised: 06/19/2015 Document Reviewed: 03/14/2012 Elsevier Interactive Patient Education  2017 Elsevier Inc.  

## 2017-05-17 NOTE — Progress Notes (Addendum)
Subjective:  Minerva EndsDashuna Lamy is a 27 y.o. G2P0010 at 5624w2d being seen today for ongoing prenatal care.  She is currently monitored for the following issues for this low-risk pregnancy and has Supervision of low-risk pregnancy on their problem list.  Patient reports no complaints.  Contractions: Not present. Vag. Bleeding: None.  Movement: Present. Denies leaking of fluid.   The following portions of the patient's history were reviewed and updated as appropriate: allergies, current medications, past family history, past medical history, past social history, past surgical history and problem list. Problem list updated.  Objective:   Vitals:   05/17/17 1006  BP: (!) 104/57  Pulse: 88  Weight: 76 kg (167 lb 9.6 oz)    Fetal Status: Fetal Heart Rate (bpm): 167   Movement: Present     General:  Alert, oriented and cooperative. Patient is in no acute distress.  Skin: Skin is warm and dry. No rash noted.   Cardiovascular: Normal heart rate noted  Respiratory: Normal respiratory effort, no problems with respiration noted  Abdomen: Soft, gravid, appropriate for gestational age. Pain/Pressure: Present     Pelvic: Vag. Bleeding: None Vag D/C Character: Watery   Cervical exam deferred        Extremities: Normal range of motion.  Edema: None  Mental Status: Normal mood and affect. Normal behavior. Normal judgment and thought content.   Urinalysis:      Assessment and Plan:  Pregnancy: G2P0010 at 6224w2d  1. Encounter for supervision of low-risk pregnancy in second trimester Doing well. Continue care. Anatomy scan scheduled. Panorama ordered.   General obstetric precautions including but not limited to vaginal bleeding, contractions, leaking of fluid and fetal movement were reviewed in detail with the patient. Please refer to After Visit Summary for other counseling recommendations.  Return in about 1 month (around 06/14/2017) for ob visit.   Pincus LargePhelps, Roseland Braun Y, DO

## 2017-05-18 ENCOUNTER — Encounter: Payer: Self-pay | Admitting: *Deleted

## 2017-05-27 ENCOUNTER — Encounter: Payer: Self-pay | Admitting: *Deleted

## 2017-06-14 ENCOUNTER — Ambulatory Visit (HOSPITAL_COMMUNITY): Payer: BLUE CROSS/BLUE SHIELD

## 2017-06-14 ENCOUNTER — Encounter: Payer: BLUE CROSS/BLUE SHIELD | Admitting: Obstetrics and Gynecology

## 2017-06-17 ENCOUNTER — Encounter: Payer: BLUE CROSS/BLUE SHIELD | Admitting: Obstetrics and Gynecology

## 2017-06-21 ENCOUNTER — Encounter (HOSPITAL_COMMUNITY): Payer: Self-pay

## 2017-06-28 ENCOUNTER — Encounter: Payer: Self-pay | Admitting: General Practice

## 2017-06-28 ENCOUNTER — Encounter: Payer: BLUE CROSS/BLUE SHIELD | Admitting: Obstetrics and Gynecology

## 2017-06-28 ENCOUNTER — Telehealth: Payer: Self-pay | Admitting: General Practice

## 2017-06-28 ENCOUNTER — Ambulatory Visit (HOSPITAL_COMMUNITY): Payer: BLUE CROSS/BLUE SHIELD | Attending: Family Medicine

## 2017-06-28 NOTE — Telephone Encounter (Signed)
Patient No Show appointment today.  Patient has cancelled the last couple of appointments. Called patient to reschedule, but no answer.  Unable to leave message d/t phone keeps ringing.  Letter will be mailed to patient.

## 2017-07-05 ENCOUNTER — Encounter (HOSPITAL_COMMUNITY): Payer: Self-pay

## 2017-07-13 ENCOUNTER — Ambulatory Visit (INDEPENDENT_AMBULATORY_CARE_PROVIDER_SITE_OTHER): Payer: BLUE CROSS/BLUE SHIELD | Admitting: Advanced Practice Midwife

## 2017-07-13 ENCOUNTER — Encounter: Payer: Self-pay | Admitting: Advanced Practice Midwife

## 2017-07-13 ENCOUNTER — Ambulatory Visit (HOSPITAL_COMMUNITY)
Admission: RE | Admit: 2017-07-13 | Discharge: 2017-07-13 | Disposition: A | Discharge status: Home / Self Care | Payer: BLUE CROSS/BLUE SHIELD | Source: Ambulatory Visit | Attending: Family Medicine | Admitting: Family Medicine

## 2017-07-13 VITALS — BP 111/67 | HR 87 | Wt 166.0 lb

## 2017-07-13 DIAGNOSIS — Z3492 Encounter for supervision of normal pregnancy, unspecified, second trimester: Secondary | ICD-10-CM | POA: Insufficient documentation

## 2017-07-13 DIAGNOSIS — Z3490 Encounter for supervision of normal pregnancy, unspecified, unspecified trimester: Secondary | ICD-10-CM

## 2017-07-13 DIAGNOSIS — Z3A24 24 weeks gestation of pregnancy: Secondary | ICD-10-CM | POA: Insufficient documentation

## 2017-07-13 NOTE — Patient Instructions (Signed)

## 2017-07-13 NOTE — Progress Notes (Signed)
   PRENATAL VISIT NOTE  Subjective:  Jenny Gould is a 27 y.o. G2P0010 at 4886w3d being seen today for ongoing prenatal care.  She is currently monitored for the following issues for this low-risk pregnancy and has Supervision of low-risk pregnancy on their problem list.  Patient reports no complaints.  Contractions: Not present. Vag. Bleeding: None.  Movement: Present. Denies leaking of fluid.   The following portions of the patient's history were reviewed and updated as appropriate: allergies, current medications, past family history, past medical history, past social history, past surgical history and problem list. Problem list updated.  Objective:   Vitals:   07/13/17 1512  BP: 111/67  Pulse: 87  Weight: 166 lb (75.3 kg)    Fetal Status: Fetal Heart Rate (bpm): 168 Fundal Height: 23 cm Movement: Present     General:  Alert, oriented and cooperative. Patient is in no acute distress.  Skin: Skin is warm and dry. No rash noted.   Cardiovascular: Normal heart rate noted  Respiratory: Normal respiratory effort, no problems with respiration noted  Abdomen: Soft, gravid, appropriate for gestational age.  Pain/Pressure: Absent     Pelvic: Cervical exam deferred        Extremities: Normal range of motion.  Edema: None  Mental Status: Normal mood and affect. Normal behavior. Normal judgment and thought content.   Assessment and Plan:  Pregnancy: G2P0010 at 8986w3d  1. Encounter for supervision of low-risk pregnancy in second trimester - Routine care - 28 week labs and GTT at [facility]  - Anatomy scan reviewed   Preterm labor symptoms and general obstetric precautions including but not limited to vaginal bleeding, contractions, leaking of fluid and fetal movement were reviewed in detail with the patient. Please refer to After Visit Summary for other counseling recommendations.  Return in about 1 month (around 08/10/2017).  No future appointments.  Thressa ShellerHeather Hogan, CNM

## 2017-08-08 ENCOUNTER — Other Ambulatory Visit: Payer: Self-pay | Admitting: *Deleted

## 2017-08-08 DIAGNOSIS — Z3492 Encounter for supervision of normal pregnancy, unspecified, second trimester: Secondary | ICD-10-CM

## 2017-08-11 ENCOUNTER — Other Ambulatory Visit: Payer: BLUE CROSS/BLUE SHIELD

## 2017-08-11 ENCOUNTER — Ambulatory Visit (INDEPENDENT_AMBULATORY_CARE_PROVIDER_SITE_OTHER): Payer: BLUE CROSS/BLUE SHIELD | Admitting: Advanced Practice Midwife

## 2017-08-11 ENCOUNTER — Encounter: Payer: Self-pay | Admitting: Advanced Practice Midwife

## 2017-08-11 VITALS — BP 105/68 | HR 81 | Wt 167.0 lb

## 2017-08-11 DIAGNOSIS — Z3493 Encounter for supervision of normal pregnancy, unspecified, third trimester: Secondary | ICD-10-CM

## 2017-08-11 DIAGNOSIS — Z23 Encounter for immunization: Secondary | ICD-10-CM | POA: Diagnosis not present

## 2017-08-11 DIAGNOSIS — Z3482 Encounter for supervision of other normal pregnancy, second trimester: Secondary | ICD-10-CM

## 2017-08-11 DIAGNOSIS — Z3492 Encounter for supervision of normal pregnancy, unspecified, second trimester: Secondary | ICD-10-CM

## 2017-08-11 NOTE — Progress Notes (Signed)
   PRENATAL VISIT NOTE  Subjective:  Jenny Gould is a 27 y.o. G2P0010 at 2987w4d being seen today for ongoing prenatal care.  She is currently monitored for the following issues for this low-risk pregnancy and has Supervision of low-risk pregnancy on their problem list.  Patient reports no complaints.  Contractions: Not present. Vag. Bleeding: None.  Movement: Absent. Denies leaking of fluid.   The following portions of the patient's history were reviewed and updated as appropriate: allergies, current medications, past family history, past medical history, past social history, past surgical history and problem list. Problem list updated.  Objective:   Vitals:   08/11/17 1140  BP: 105/68  Pulse: 81  Weight: 167 lb (75.8 kg)    Fetal Status: Fetal Heart Rate (bpm): 166 Fundal Height: 28 cm Movement: Absent     General:  Alert, oriented and cooperative. Patient is in no acute distress.  Skin: Skin is warm and dry. No rash noted.   Cardiovascular: Normal heart rate noted  Respiratory: Normal respiratory effort, no problems with respiration noted  Abdomen: Soft, gravid, appropriate for gestational age.  Pain/Pressure: Absent     Pelvic: Cervical exam deferred        Extremities: Normal range of motion.  Edema: Other (Comment)  Mental Status: Normal mood and affect. Normal behavior. Normal judgment and thought content.   Assessment and Plan:  Pregnancy: G2P0010 at 1087w4d  1. Encounter for supervision of low-risk pregnancy in third trimester Patient is doing well Third trimester labs and tdap today - Glucose Tolerance, 2 Hours w/1 Hour - CBC - HIV antibody - RPR  2. Need for diphtheria-tetanus-pertussis (Tdap) vaccine  - Tdap vaccine greater than or equal to 7yo IM  Preterm labor symptoms and general obstetric precautions including but not limited to vaginal bleeding, contractions, leaking of fluid and fetal movement were reviewed in detail with the patient. Please refer to  After Visit Summary for other counseling recommendations.  Return in about 2 weeks (around 08/25/2017) for ROB.  No future appointments.  Catalina AntiguaPeggy Irl Bodie, MD

## 2017-08-12 LAB — CBC
HEMOGLOBIN: 11.3 g/dL (ref 11.1–15.9)
Hematocrit: 33.6 % — ABNORMAL LOW (ref 34.0–46.6)
MCH: 31.5 pg (ref 26.6–33.0)
MCHC: 33.6 g/dL (ref 31.5–35.7)
MCV: 94 fL (ref 79–97)
PLATELETS: 225 10*3/uL (ref 150–450)
RBC: 3.59 x10E6/uL — AB (ref 3.77–5.28)
RDW: 12.1 % — ABNORMAL LOW (ref 12.3–15.4)
WBC: 11.3 10*3/uL — AB (ref 3.4–10.8)

## 2017-08-12 LAB — GLUCOSE TOLERANCE, 2 HOURS W/ 1HR
GLUCOSE, 1 HOUR: 114 mg/dL (ref 65–179)
GLUCOSE, FASTING: 70 mg/dL (ref 65–91)
Glucose, 2 hour: 93 mg/dL (ref 65–152)

## 2017-08-12 LAB — HIV ANTIBODY (ROUTINE TESTING W REFLEX): HIV Screen 4th Generation wRfx: NONREACTIVE

## 2017-08-12 LAB — RPR: RPR Ser Ql: NONREACTIVE

## 2017-08-26 ENCOUNTER — Telehealth: Payer: Self-pay | Admitting: *Deleted

## 2017-08-26 ENCOUNTER — Encounter: Payer: Self-pay | Admitting: Obstetrics and Gynecology

## 2017-08-26 NOTE — Telephone Encounter (Signed)
Jenny Gould called 08/25/17 7pm and left a voicemail she is calling about getting her medical documents / history [facility]. Asks for a call back.

## 2017-08-30 ENCOUNTER — Encounter: Payer: BLUE CROSS/BLUE SHIELD | Admitting: Student

## 2017-08-31 NOTE — Telephone Encounter (Signed)
I called Jenny Gould back and she verified she went to the [place] office and they sent a form. I verified it was scanned in; I informed usually is scanned after it is sent and she can check back with [place] office; let us know if issues.

## 2018-01-29 ENCOUNTER — Encounter (HOSPITAL_COMMUNITY): Payer: Self-pay

## 2018-10-28 IMAGING — US US OB COMP LESS 14 WK
1 series · 15 of 28 positions shown · non-contrast
Comparison: None.

CLINICAL DATA: Vaginal bleeding and cramping

EXAM:
OBSTETRIC <14 WK US AND TRANSVAGINAL OB US
TECHNIQUE: Both transabdominal and transvaginal ultrasound examinations were
performed for complete evaluation of the gestation as well as the
maternal uterus, adnexal regions, and pelvic cul-de-sac.
Transvaginal technique was performed to assess early pregnancy.

[Series 1: us ob comp less 14 wk · 15 of 59 slices shown]
[im 1/59]
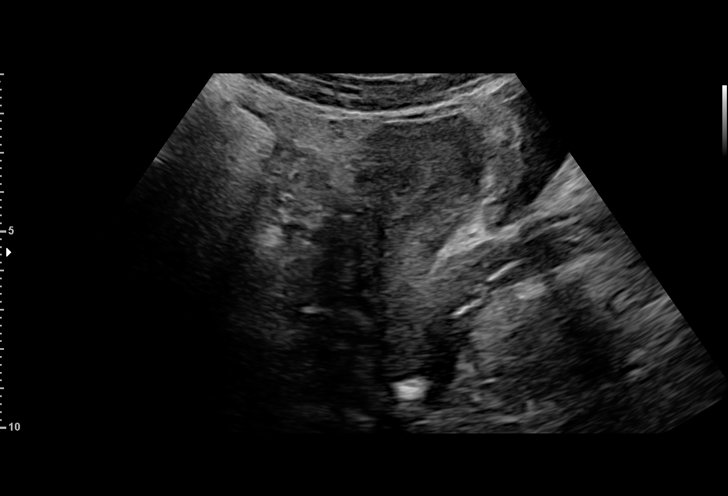
[im 5/59]
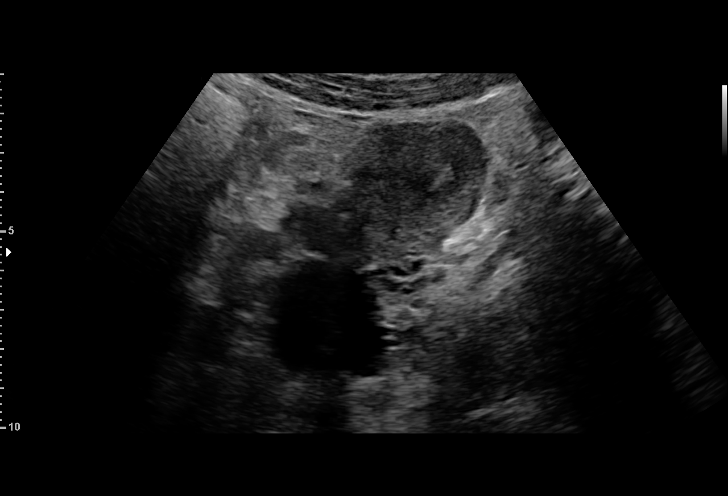
[im 9/59]
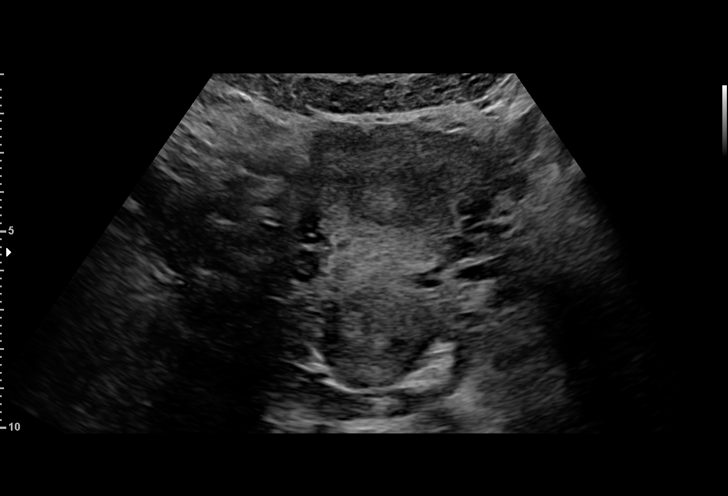
[im 13/59]
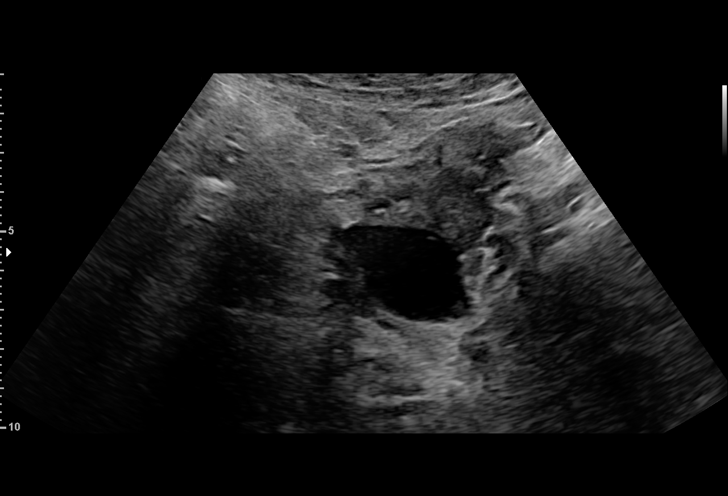
[im 18/59]
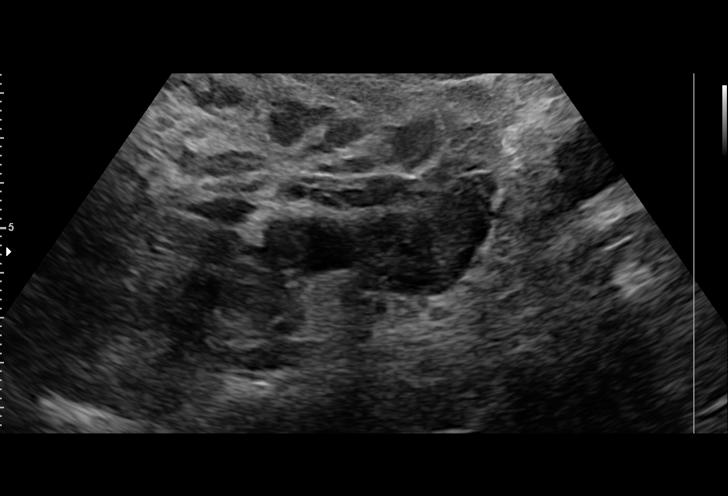
[im 22/59]
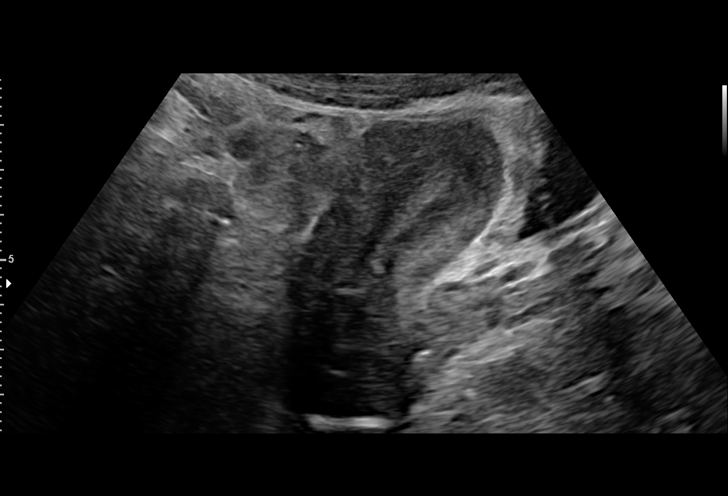
[im 26/59]
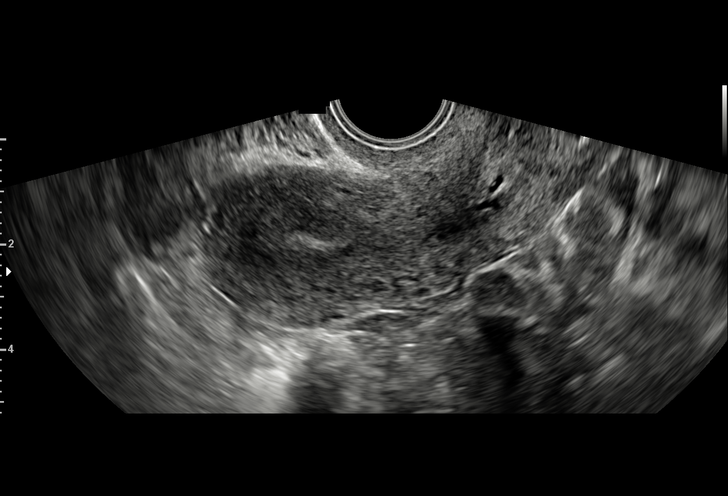
[im 31/59]
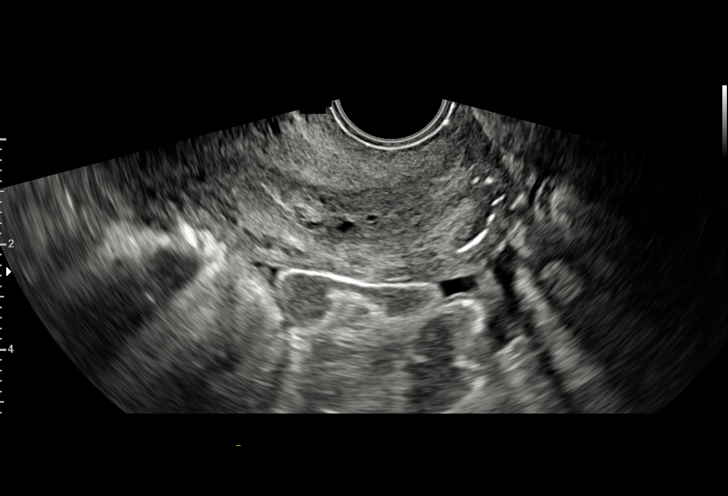
[im 33/59]
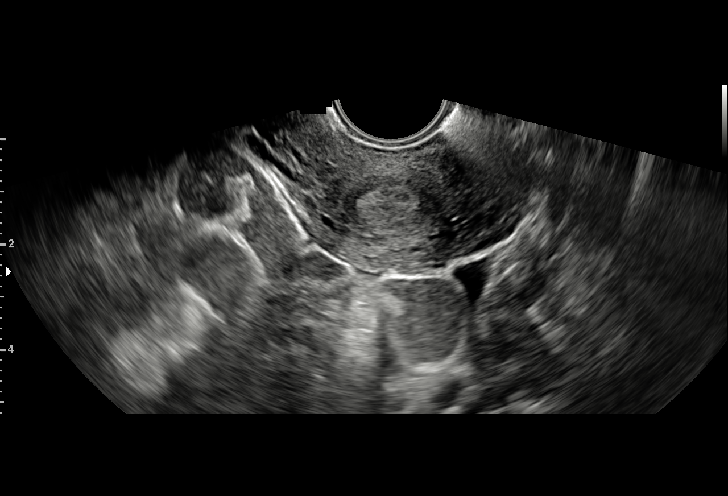
[im 37/59]
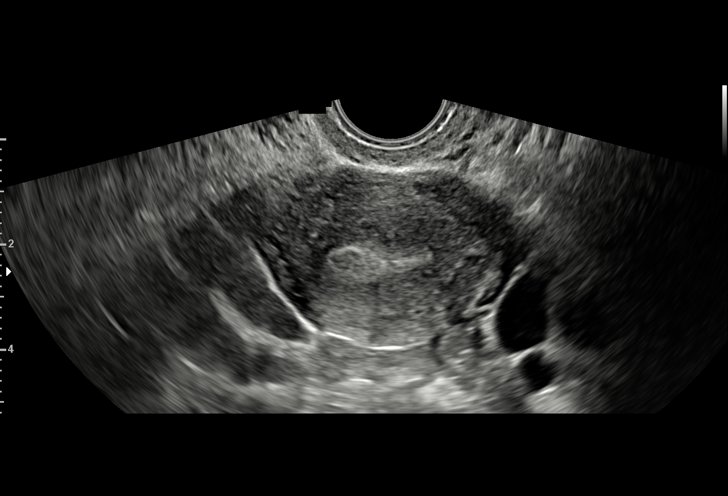
[im 41/59]
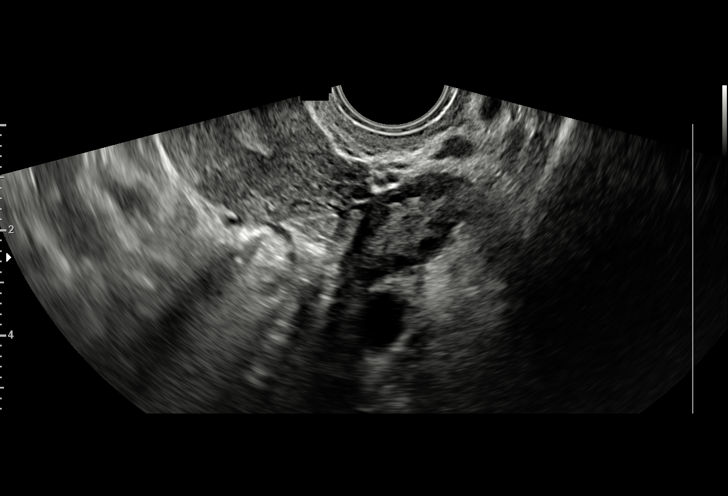
[im 46/59]
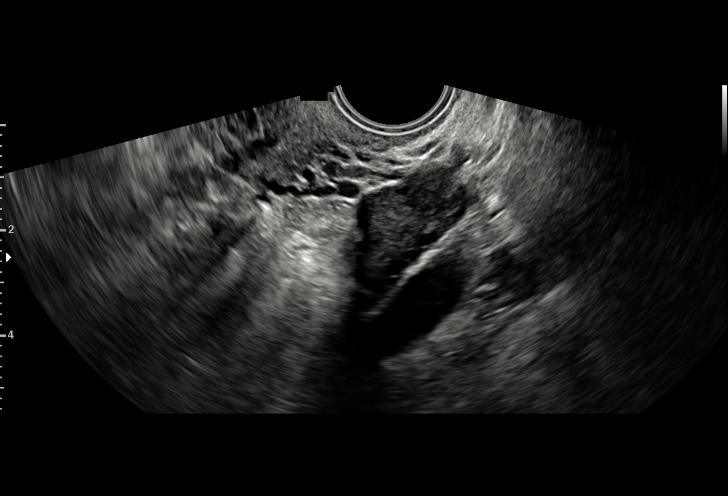
[im 50/59]
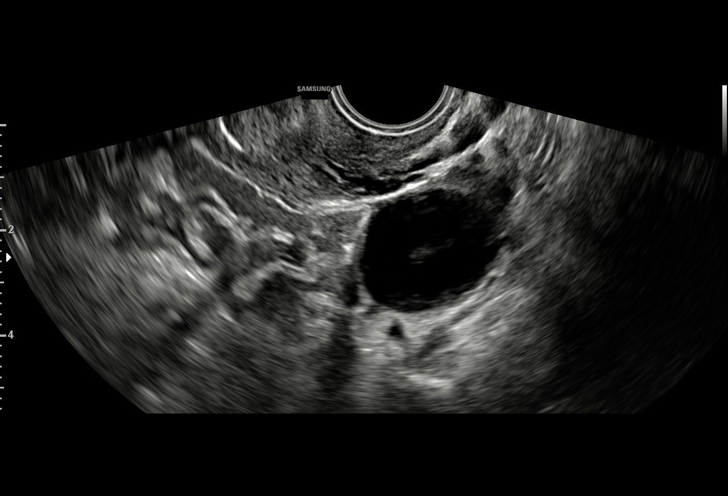
[im 54/59]
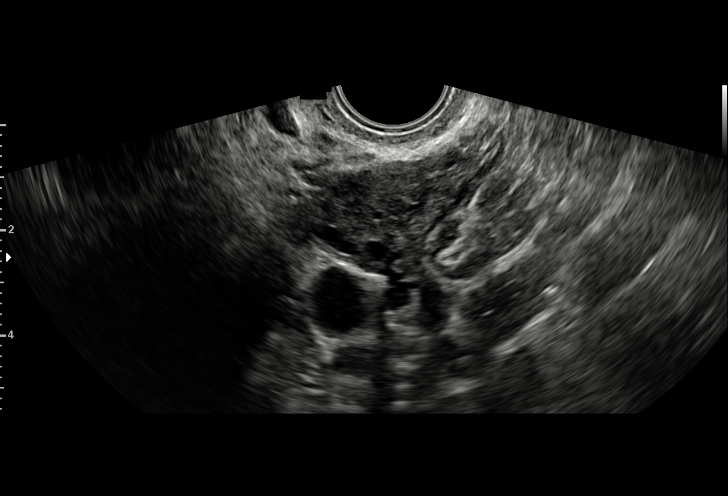
[im 59/59]
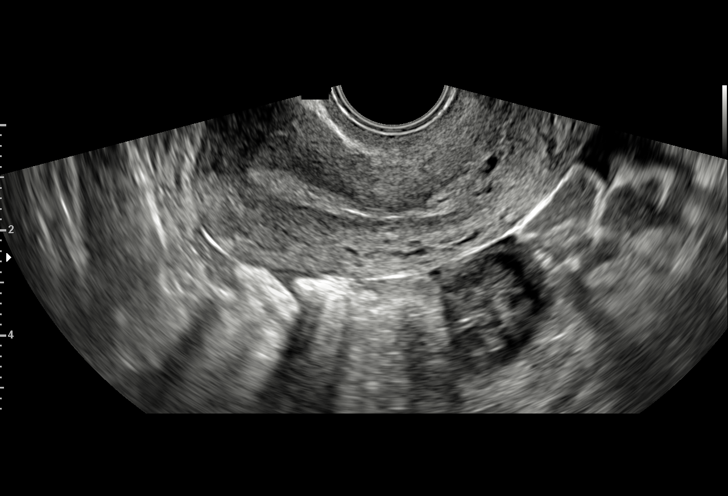

[15 of 28 positions shown; findings below may reference images not displayed]

FINDINGS: Intrauterine gestational sac: Not visualized

Yolk sac:  Not visualized

Embryo:  Not visualized

Maternal uterus/adnexae: Right ovary is within normal limits and
measures 2.1 by 3.5 x 2.2 cm. The left ovary measures 1.9 by 3 x
cm. Adjacent to or exophytic from the left ovary is a simple cysts
measuring 3.2 x 2.4 by 3 cm.

No free fluid
IMPRESSION: 1. No intrauterine pregnancy is visualized. Correlation with serial
HCG and follow-up pelvic ultrasound as clinically indicated
2. 3.2 cm simple appearing left adnexal cyst

## 2019-10-04 IMAGING — US US OB TRANSVAGINAL
1 series · 15 of 28 positions shown · non-contrast
Comparison: None.

CLINICAL DATA: Intermittent abdominal cramping. Pregnant patient.
Patient is 6 weeks and 5 days pregnant based on her last menstrual
period.

EXAM:
OBSTETRIC <14 WK US AND TRANSVAGINAL OB US
TECHNIQUE: Both transabdominal and transvaginal ultrasound examinations were
performed for complete evaluation of the gestation as well as the
maternal uterus, adnexal regions, and pelvic cul-de-sac.
Transvaginal technique was performed to assess early pregnancy.

[Series 1: us ob transvaginal · 15 of 58 slices shown]
[im 1/58]
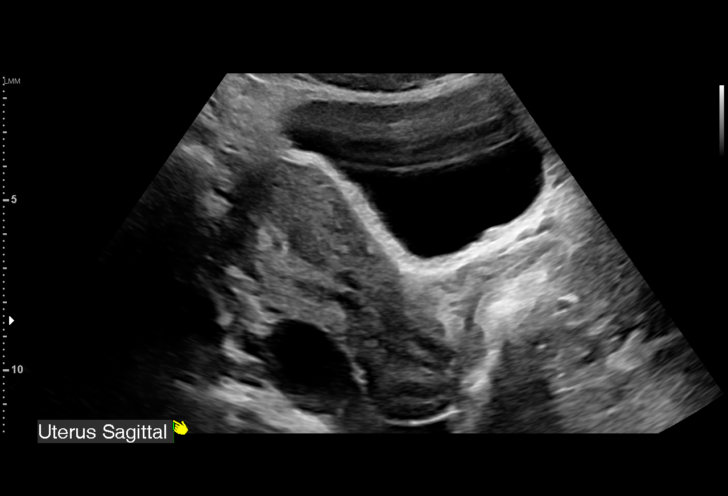
[im 5/58]
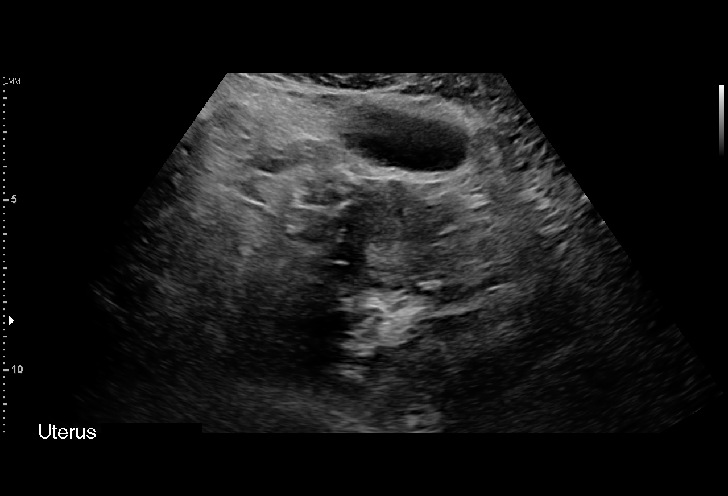
[im 9/58]
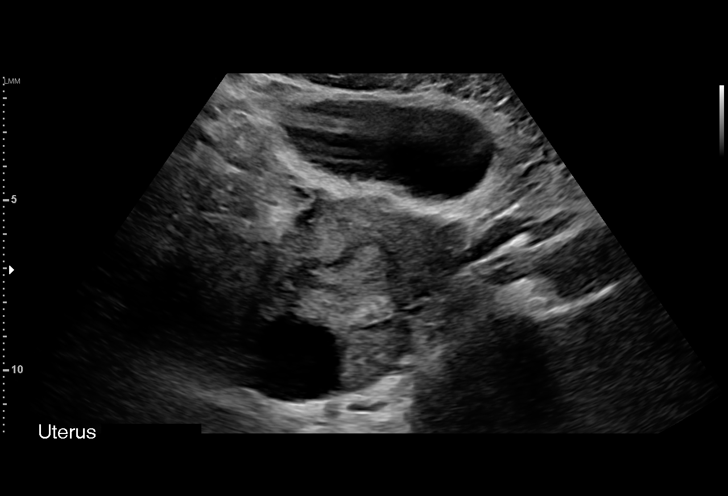
[im 13/58]
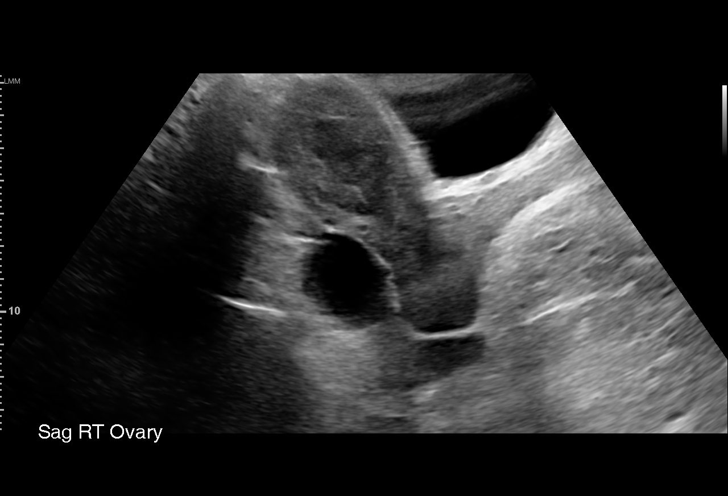
[im 17/58]
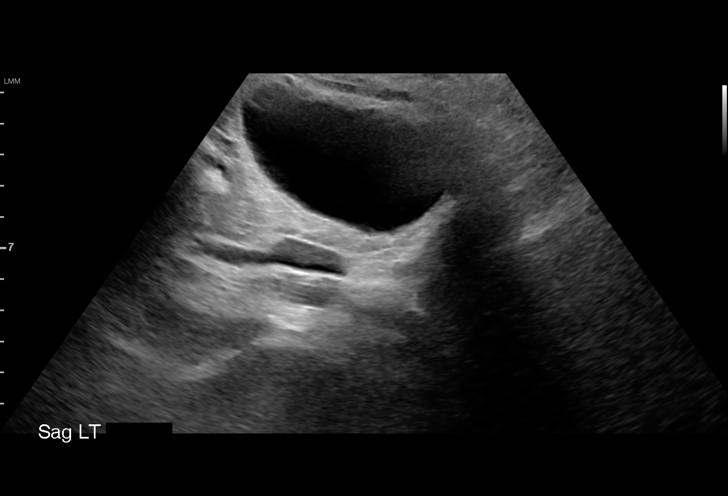
[im 22/58]
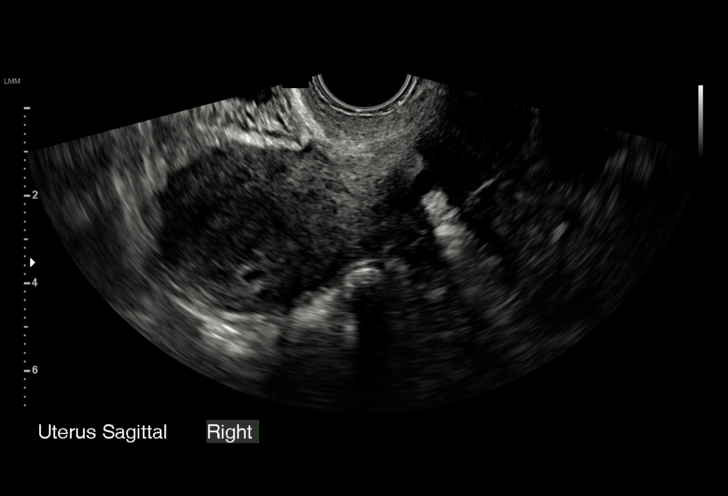
[im 26/58]
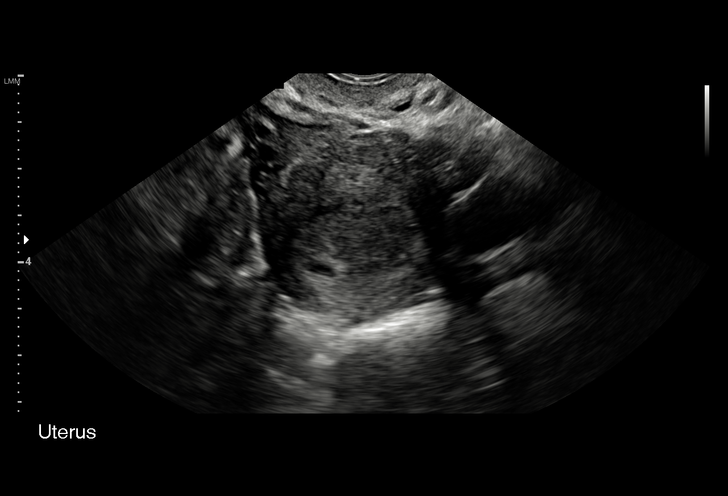
[im 30/58]
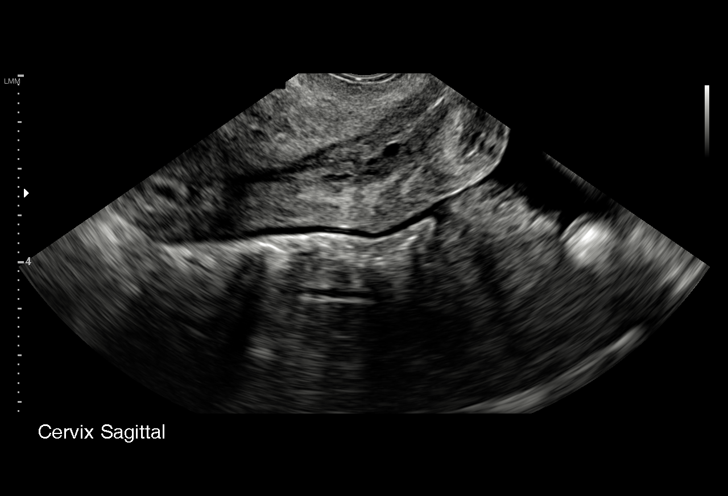
[im 32/58]
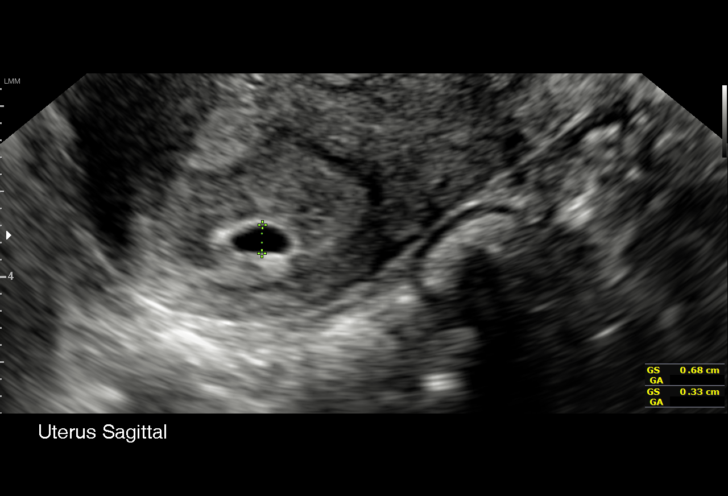
[im 36/58]
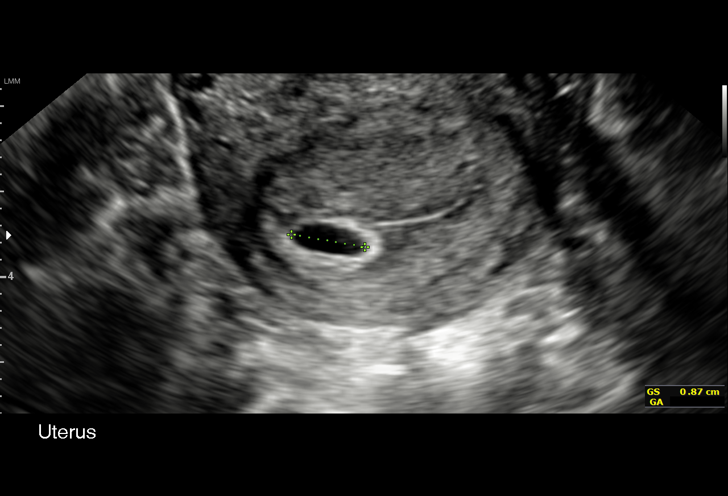
[im 41/58]
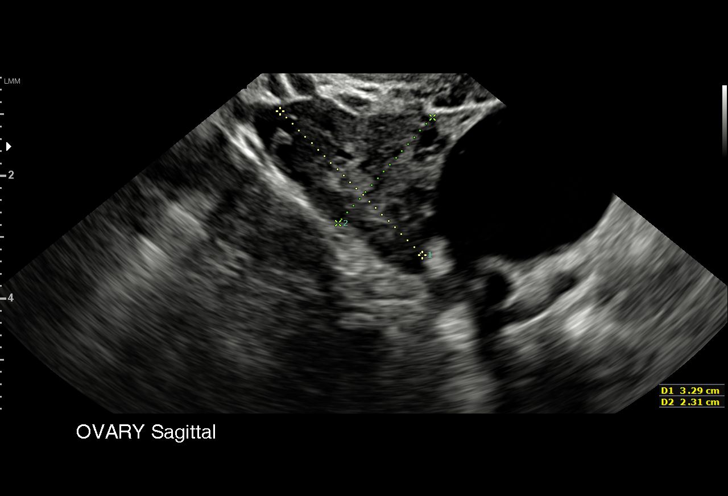
[im 45/58]
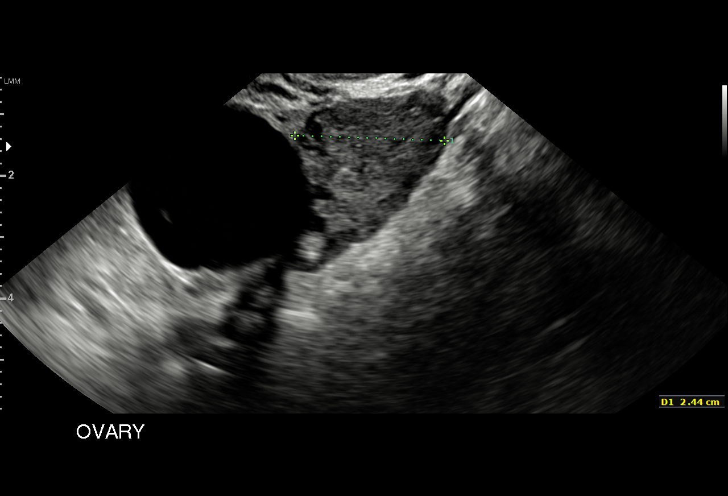
[im 49/58]
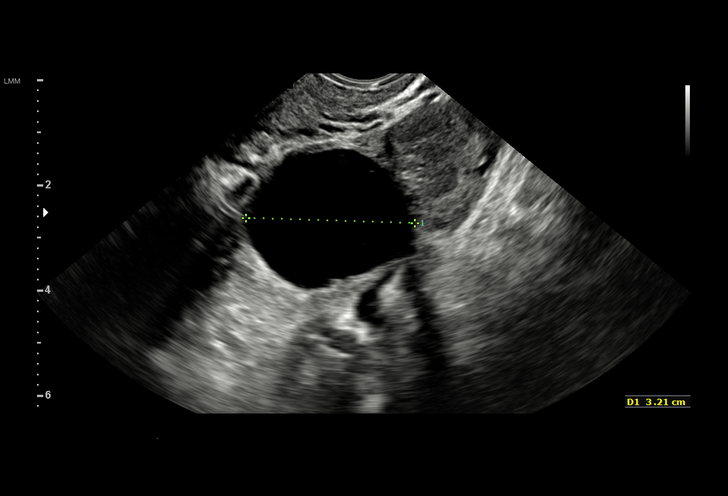
[im 53/58]
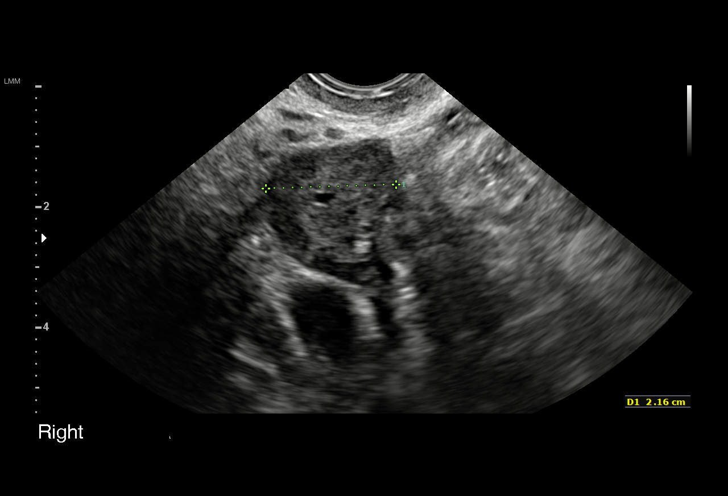
[im 58/58]
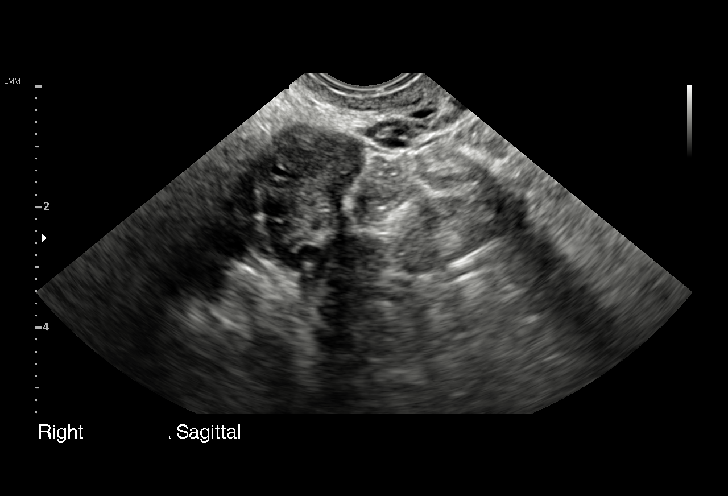

[15 of 28 positions shown; findings below may reference images not displayed]

FINDINGS: Intrauterine gestational sac: Single

Yolk sac:  Not Visualized.

Embryo:  Not Visualized.

Cardiac Activity: Not Visualized.

MSD: 6.5 mm   5 w   2 d

Subchorionic hemorrhage:  None visualized.

Maternal uterus/adnexae: No uterine masses. Cervix is closed. There
is an exophytic cyst arising from the left ovary measuring 3.4 cm in
greatest dimension. This is a simple appearing cyst. Ovary otherwise
unremarkable. Normal right ovary. No adnexal masses. Small amount of
cul-de-sac free fluid.
IMPRESSION: 1. Intrauterine gestational sac with no visualized embryo or yolk
sac. Probable early intrauterine gestational sac, but no yolk sac,
fetal pole, or cardiac activity yet visualized. Recommend follow-up
quantitative B-HCG levels and follow-up US in 14 days to assess
viability. This recommendation follows SRU consensus guidelines:
Diagnostic Criteria for Nonviable Pregnancy Early in the First
Trimester. N Engl J Med 1263; [DATE].
2. No subchorionic hemorrhage or other evidence of a pregnancy
complication.
3. 3.4 cm simple left ovarian cyst.

## 2019-10-21 IMAGING — US US OB TRANSVAGINAL
1 series · 15 of 28 positions shown · non-contrast
Comparison: 03/05/2017

CLINICAL DATA: Pregnant, inconclusive fetal viability, left lower
quadrant pain

EXAM:
TRANSVAGINAL OB ULTRASOUND
TECHNIQUE: Transvaginal ultrasound was performed for complete evaluation of the
gestation as well as the maternal uterus, adnexal regions, and
pelvic cul-de-sac.

[Series 1: us ob transvaginal · 15 of 62 slices shown]
[im 1/62]
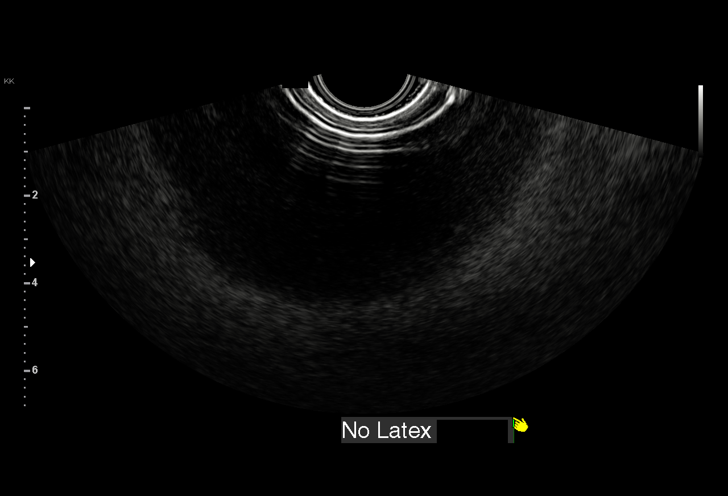
[im 5/62]
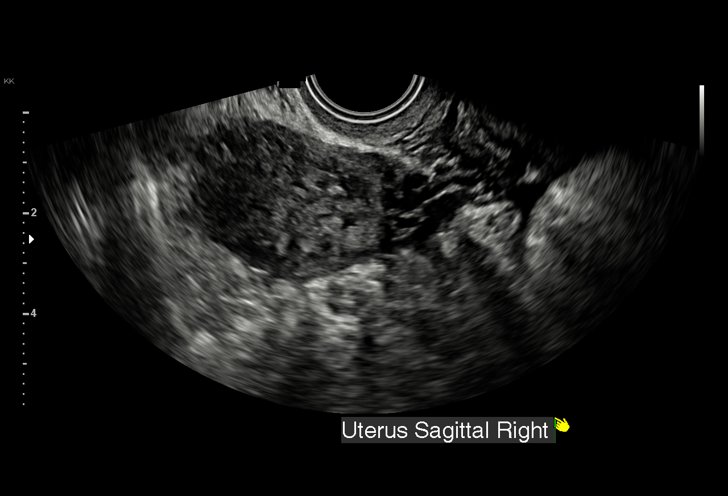
[im 10/62]
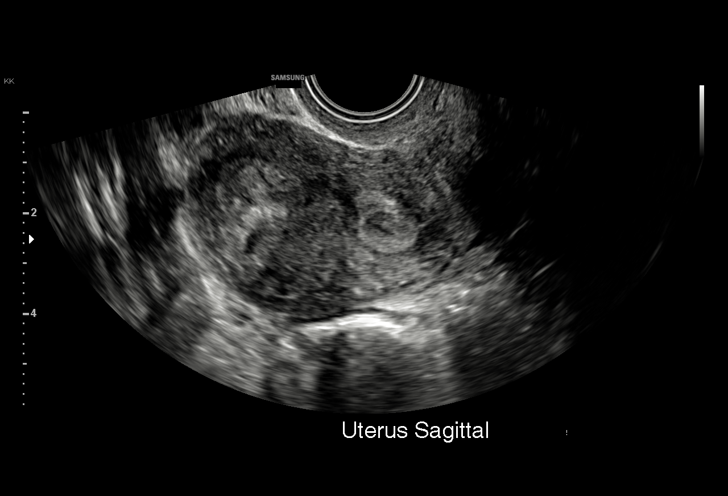
[im 14/62]
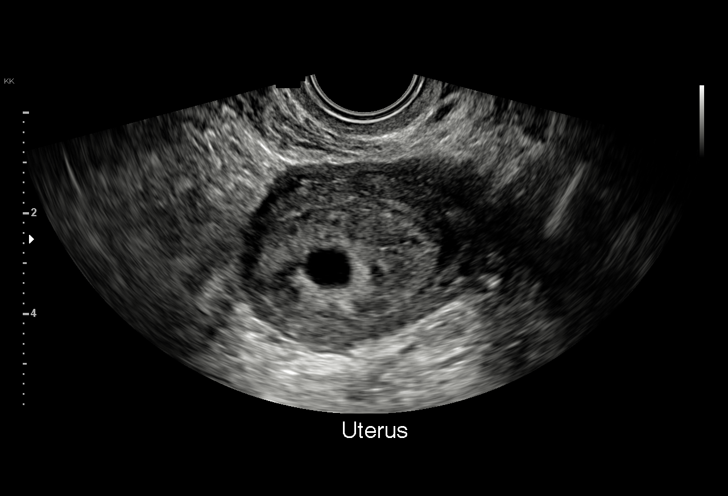
[im 19/62]
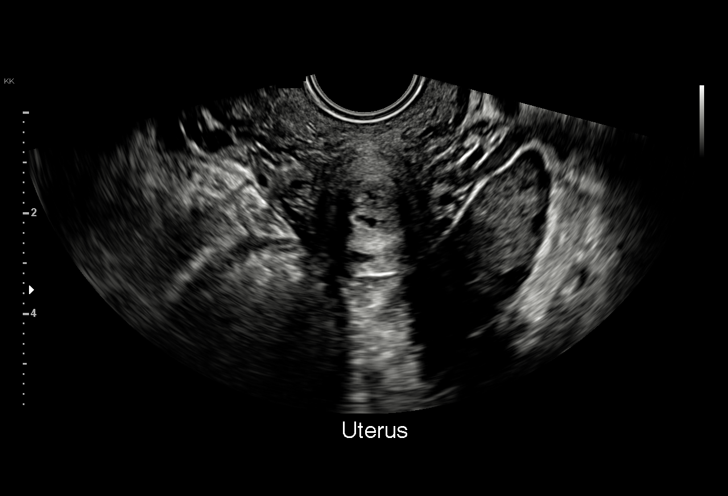
[im 23/62]
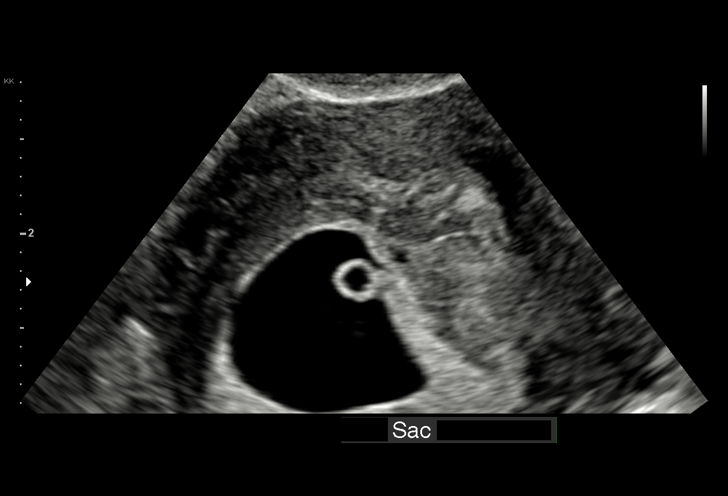
[im 28/62]
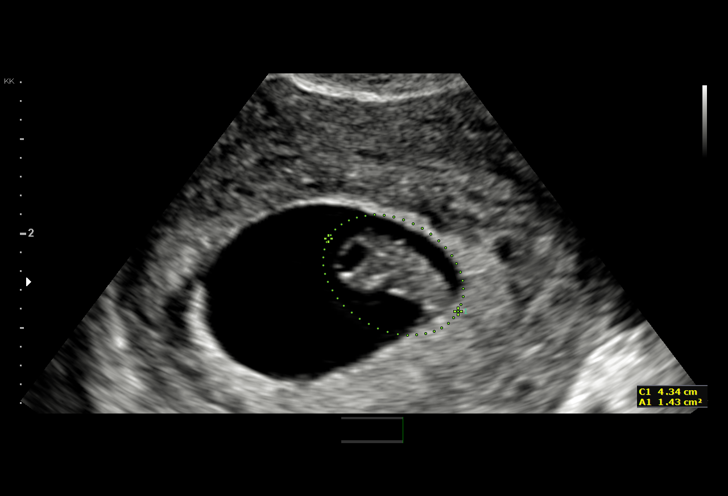
[im 32/62]
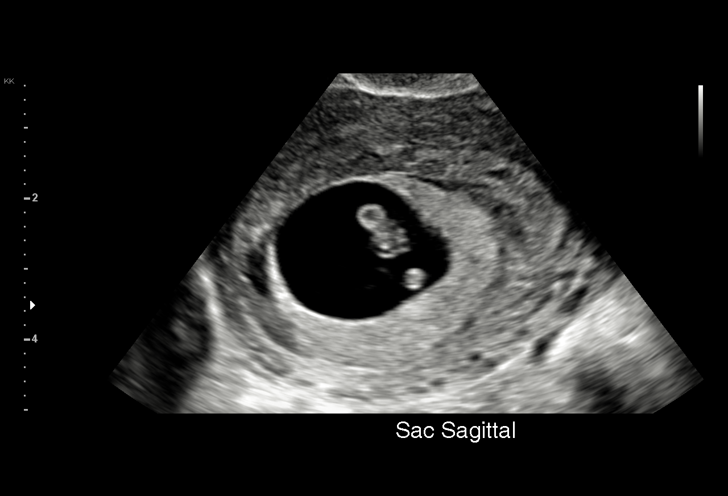
[im 34/62]
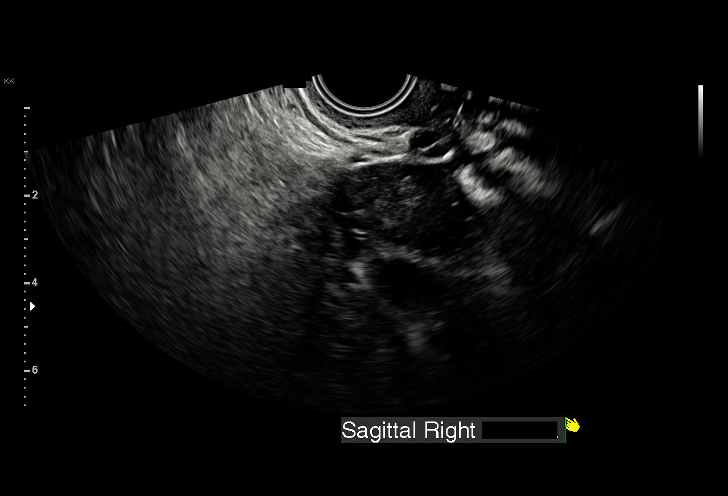
[im 39/62]
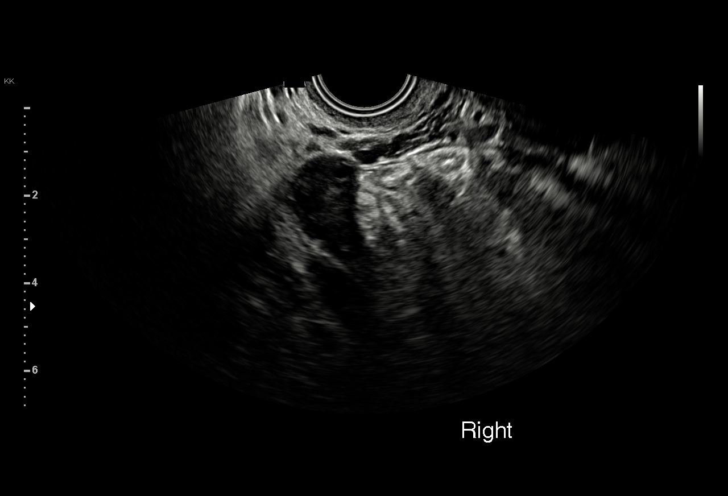
[im 43/62]
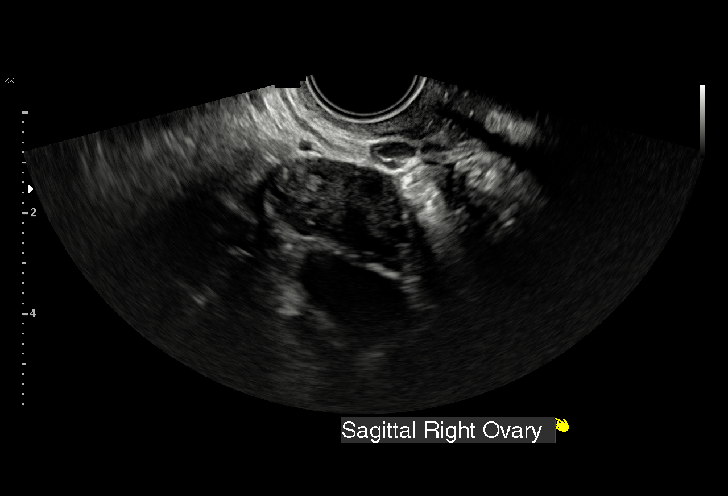
[im 48/62]
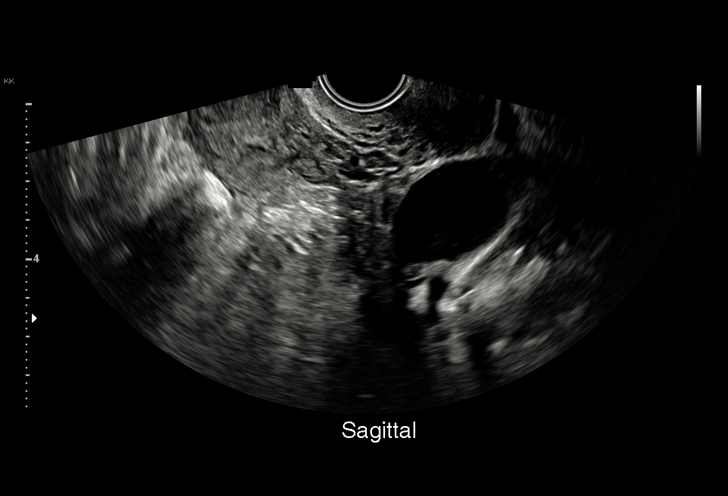
[im 52/62]
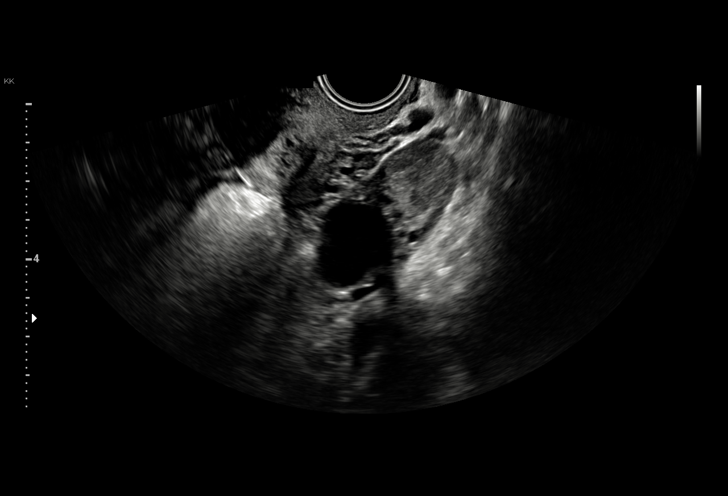
[im 57/62]
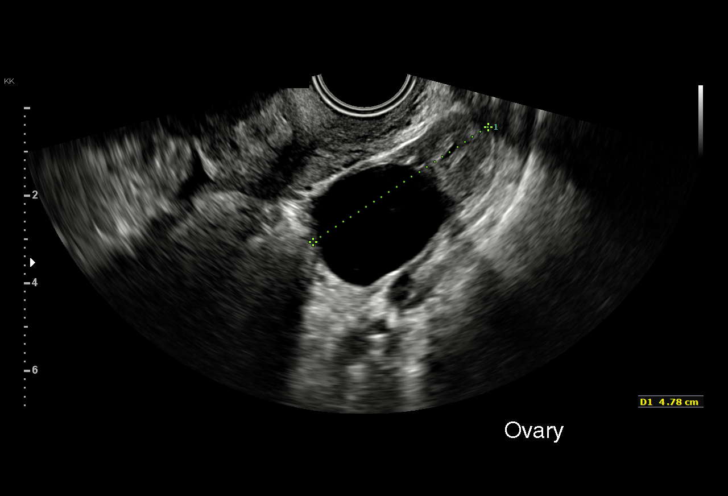
[im 62/62]
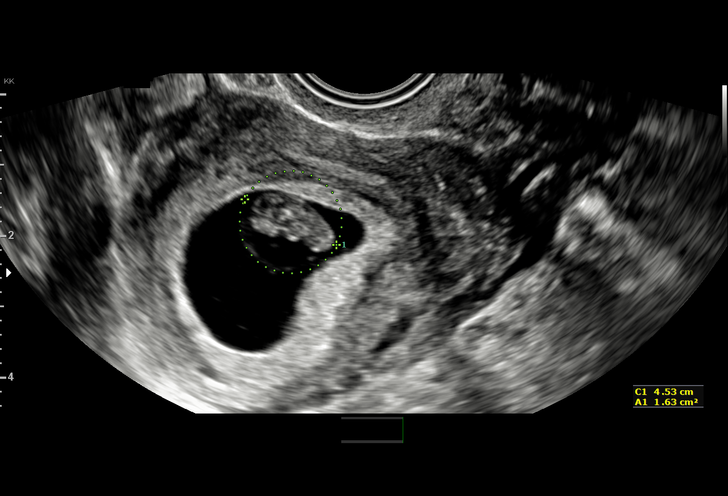

[15 of 28 positions shown; findings below may reference images not displayed]

FINDINGS: Intrauterine gestational sac: Single

Yolk sac:  Visualized.

Embryo:  Visualized.

Cardiac Activity: Visualized.

Heart Rate: 141 bpm

CRL:   13.5 mm   7 w 4 d                  US EDC: 11/04/2017

Subchorionic hemorrhage:  None visualized.

Maternal uterus/adnexae: Right ovary is within normal limits.

Left ovary is notable for corpus luteal cyst and a minimally complex
3.5 cm cyst (predominantly simple).

Small to moderate pelvic ascites.
IMPRESSION: Single live intrauterine gestation, with estimated gestational age 7
weeks 4 days by crown-rump length, as above.
